# Patient Record
Sex: Male | Born: 1980 | Race: White | Hispanic: No | Marital: Single | Smoking: Former smoker
Health system: Southern US, Community
[De-identification: ages and names within clinical notes are randomized; demographics above are authoritative.]

---

## 2004-05-12 ENCOUNTER — Emergency Department: Payer: Self-pay | Admitting: Emergency Medicine

## 2005-07-29 ENCOUNTER — Emergency Department: Payer: Self-pay | Admitting: Emergency Medicine

## 2005-08-29 ENCOUNTER — Emergency Department: Payer: Self-pay | Admitting: General Practice

## 2005-09-16 ENCOUNTER — Emergency Department: Payer: Self-pay | Admitting: Unknown Physician Specialty

## 2005-09-16 ENCOUNTER — Other Ambulatory Visit: Payer: Self-pay

## 2008-02-17 ENCOUNTER — Emergency Department: Payer: Self-pay | Admitting: Emergency Medicine

## 2008-07-23 ENCOUNTER — Emergency Department: Payer: Self-pay | Admitting: Emergency Medicine

## 2008-12-16 ENCOUNTER — Emergency Department: Payer: Self-pay | Admitting: Emergency Medicine

## 2014-02-10 ENCOUNTER — Encounter: Payer: Self-pay | Admitting: Podiatry

## 2014-02-10 ENCOUNTER — Ambulatory Visit: Payer: No Typology Code available for payment source | Admitting: Podiatry

## 2014-02-10 VITALS — BP 125/76 | HR 61 | Resp 16 | Ht 73.0 in | Wt 240.0 lb

## 2014-02-10 DIAGNOSIS — M79609 Pain in unspecified limb: Secondary | ICD-10-CM

## 2014-02-10 DIAGNOSIS — B372 Candidiasis of skin and nail: Secondary | ICD-10-CM

## 2014-02-10 DIAGNOSIS — Z79899 Other long term (current) drug therapy: Secondary | ICD-10-CM

## 2014-02-10 DIAGNOSIS — B351 Tinea unguium: Secondary | ICD-10-CM

## 2014-02-10 MED ORDER — TERBINAFINE HCL 250 MG PO TABS: 250.0000 mg | ORAL_TABLET | Freq: Every day | ORAL | Status: DC

## 2014-02-10 NOTE — Progress Notes (Signed)
Subjective:     Patient ID: Robert Jensen, male   DOB: 12/25/1980, 33 y.o.   MRN: [id number]  HPI patient presents with significant nail disease of both feet and moccasin appearance of the skin secondary to fungal infection. States he had Lamisil of a short course years ago which improved but he stopped taking   Review of Systems  All other systems reviewed and are negative.      Objective:   Physical Exam  Nursing note and vitals reviewed. Constitutional: He is oriented to person, place, and time.  Cardiovascular: Intact distal pulses.   Musculoskeletal: Normal range of motion.  Neurological: He is oriented to person, place, and time.  Skin: Skin is warm.   neurovascular status intact with muscle strength adequate and range of motion subtalar and midtarsal joint within normal limits. Patient's found to have thick nails hallux bilateral and 3 and 5 of both feet along with a moccasin appearance to his skin consistent with change    Assessment:     Mycotic nail infection and skin infection    Plan:     H&P performed and condition discussed and have recommended oral Lamisil x90 days with first we will get liver function and CBC followed by formula 3 treatment. Reappoint in 5 months and hopefully we can put him on a maintenance dose

## 2014-02-10 NOTE — Progress Notes (Signed)
   Subjective:    Patient ID: Robert Jensen, male    DOB: 06/27/1981, 33 y.o.   MRN: [id number]  HPI Comments: i have a nail fungus on both of my feet. Ive had it 10 + years. They get better then worse again. i pull the nail off at times. i took lamisil 6 - 7 years ago. It did work but i didn't finish the prescription.      Review of Systems  Skin: Positive for color change.  All other systems reviewed and are negative.      Objective:   Physical Exam        Assessment & Plan:

## 2014-02-20 ENCOUNTER — Encounter: Payer: Self-pay | Admitting: Podiatry

## 2014-02-20 NOTE — Progress Notes (Signed)
Per dr Charlsie Merlesregal labs ok repeat in 8 weeks

## 2014-05-08 ENCOUNTER — Observation Stay: Payer: Self-pay | Admitting: Internal Medicine

## 2014-05-08 LAB — DRUG SCREEN, URINE

## 2014-05-08 LAB — URINALYSIS, COMPLETE
BACTERIA: NONE SEEN
BILIRUBIN, UR: NEGATIVE
BLOOD: NEGATIVE
LEUKOCYTE ESTERASE: NEGATIVE
NITRITE: NEGATIVE
PH: 8 (ref 4.5–8.0)
Protein: NEGATIVE
RBC,UR: 1 /HPF (ref 0–5)
Specific Gravity: 1.006 (ref 1.003–1.030)
Squamous Epithelial: <1
WBC UR: NONE SEEN /HPF (ref 0–5)

## 2014-05-08 LAB — COMPREHENSIVE METABOLIC PANEL
ALK PHOS: 97 U/L
ALT: 39 U/L
Albumin: 4 g/dL (ref 3.4–5.0)
Anion Gap: 10 (ref 7–16)
BILIRUBIN TOTAL: 0.7 mg/dL (ref 0.2–1.0)
BUN: 10 mg/dL (ref 7–18)
Calcium, Total: 8.4 mg/dL — ABNORMAL LOW (ref 8.5–10.1)
Chloride: 103 mmol/L (ref 98–107)
Co2: 25 mmol/L (ref 21–32)
Creatinine: 1.06 mg/dL (ref 0.60–1.30)
GLUCOSE: 193 mg/dL — AB (ref 65–99)
OSMOLALITY: 280 (ref 275–301)
POTASSIUM: 3.5 mmol/L (ref 3.5–5.1)
SGOT(AST): 21 U/L (ref 15–37)
Sodium: 138 mmol/L (ref 136–145)
Total Protein: 7.9 g/dL (ref 6.4–8.2)

## 2014-05-08 LAB — CBC
HCT: 46.4 %
HGB: 15.4 g/dL
MCH: 30.3 pg
MCHC: 33.2 g/dL
MCV: 91 fL
Platelet: 208 x10 3/mm 3
RBC: 5.08 x10 6/mm 3
RDW: 13.7 %
WBC: 5.5 x10 3/mm 3

## 2014-05-08 LAB — TSH: Thyroid Stimulating Horm: 0.836 u[IU]/mL

## 2014-05-08 LAB — PROTIME-INR
INR: 1.1
PROTHROMBIN TIME: 13.6 s (ref 11.5–14.7)

## 2014-05-08 LAB — TROPONIN I
Troponin-I: <0.02 ng/mL
Troponin-I: <0.02 ng/mL

## 2014-05-08 LAB — APTT: Activated PTT: 25.6 secs (ref 23.6–35.9)

## 2014-05-08 LAB — HEMOGLOBIN A1C: Hemoglobin A1C: 5.6 % (ref 4.2–6.3)

## 2014-05-09 DIAGNOSIS — R002 Palpitations: Secondary | ICD-10-CM

## 2014-05-09 DIAGNOSIS — G459 Transient cerebral ischemic attack, unspecified: Secondary | ICD-10-CM

## 2014-05-09 DIAGNOSIS — I10 Essential (primary) hypertension: Secondary | ICD-10-CM

## 2014-05-09 LAB — CBC WITH DIFFERENTIAL/PLATELET
BASOS PCT: 0.3 %
Basophil #: 0 10*3/uL (ref 0.0–0.1)
Eosinophil #: 0.1 10*3/uL (ref 0.0–0.7)
Eosinophil %: 2.2 %
HCT: 44.3 % (ref 40.0–52.0)
HGB: 14.8 g/dL (ref 13.0–18.0)
LYMPHS PCT: 26.3 %
Lymphocyte #: 1.8 10*3/uL (ref 1.0–3.6)
MCH: 31.1 pg (ref 26.0–34.0)
MCHC: 33.3 g/dL (ref 32.0–36.0)
MCV: 94 fL (ref 80–100)
MONO ABS: 0.8 x10 3/mm (ref 0.2–1.0)
MONOS PCT: 11.3 %
Neutrophil #: 4.1 10*3/uL (ref 1.4–6.5)
Neutrophil %: 59.9 %
PLATELETS: 204 10*3/uL (ref 150–440)
RBC: 4.74 10*6/uL (ref 4.40–5.90)
RDW: 13.9 % (ref 11.5–14.5)
WBC: 6.8 10*3/uL (ref 3.8–10.6)

## 2014-05-09 LAB — LIPID PANEL
CHOLESTEROL: 174 mg/dL (ref 0–200)
HDL: 36 mg/dL — AB (ref 40–60)
Ldl Cholesterol, Calc: 120 mg/dL — ABNORMAL HIGH (ref 0–100)
TRIGLYCERIDES: 91 mg/dL (ref 0–200)
VLDL Cholesterol, Calc: 18 mg/dL (ref 5–40)

## 2014-05-09 LAB — BASIC METABOLIC PANEL
ANION GAP: 9 (ref 7–16)
BUN: 9 mg/dL (ref 7–18)
CO2: 25 mmol/L (ref 21–32)
Calcium, Total: 8.1 mg/dL — ABNORMAL LOW (ref 8.5–10.1)
Chloride: 106 mmol/L (ref 98–107)
Creatinine: 0.78 mg/dL (ref 0.60–1.30)
EGFR (Non-African Amer.): >60
Glucose: 92 mg/dL (ref 65–99)
OSMOLALITY: 278 (ref 275–301)
Potassium: 3.6 mmol/L (ref 3.5–5.1)
Sodium: 140 mmol/L (ref 136–145)

## 2014-05-09 LAB — TROPONIN I

## 2014-07-14 ENCOUNTER — Ambulatory Visit: Payer: No Typology Code available for payment source | Admitting: Podiatry

## 2014-10-28 NOTE — Consult Note (Signed)
General Aspect Primary Cardiologist: New to [facility] _________________  34 year old male with history of HTN and onychomyocsis who presented to [facility] on 05/08/2014 after developing palpitations and left upper extremity and lower extremity numbness while at work. Paresthesias lasted for approximately 6 hours before self resolving. HR was found to be in the 120s with a BP of 170/110. Cardiology was consulted for further evaluation.  _________________  PMH: 1. HTN 2. Onychomyosis __________________   Present Illness 34 year old male with the above problem list who presented to [facility] on 05/08/2014 after developing palpitations and left upper extremity and lower extremity numbness while at work. Paresthesias lasted for approximately 6 hours before self resolving. HR was found to be in the 120s with a BP of 170/110. cardiology was consulted for possible TIA and tachycardia  Patient without any known prior cardiac history.  He states his BP has been previously well controlled having recently been seen in the outpatient setting with a reading in the 120s/80s. He does not take any antihypertensives. He was in his [employer] the prior day eating lunch at work (works at a [employer]) when he developed left upper and lower paresthesias. This was assocaited with palpitations. EMS was called. Patient had a HR in the 140s. BP 170/110. He was brought to [facility]. At [facility] his HR began to slow into the 120s. He was complaining of peri-oral numbness and initially unable to smile, however this resolved without intervention. He felt lightheaded. He drinks 2 caffeine drinks (1 soda and 1 coffee per day). No drugs. Drinks a couple of beers per week and smokes a couple of cigarettes.   Code stroke was not initiated. Neurology was not called. He denied any weakness, denied any blurry vision, double vision. He denied any swallowing difficulties or speech difficulties. EKG with sinus tach 119, baseline artifact, no st/t changes.  Telemetry NSR, 60s. TnI negative x 3. UDS negative. See labs for remaining details. CT head with no acute abnormality.   Physical Exam:  GEN well developed, well nourished, no acute distress   HEENT hearing intact to voice, moist oral mucosa   NECK supple   RESP normal resp effort  clear BS   CARD Regular rate and rhythm  Normal, S1, S2  No murmur   ABD denies tenderness  soft  normal BS   LYMPH negative neck   EXTR negative edema   SKIN normal to palpation   NEURO motor/sensory function intact   PSYCH alert, A+O to time, place, person, good insight   Review of Systems:  Subjective/Chief Complaint tingling and weakness on the left side, resolved Tachycardia, resolved   General: No Complaints   Skin: No Complaints   ENT: No Complaints   Eyes: No Complaints   Neck: No Complaints   Respiratory: No Complaints   Cardiovascular: Palpitations   Gastrointestinal: No Complaints   Genitourinary: No Complaints   Vascular: No Complaints   Musculoskeletal: No Complaints   Neurologic: paresthesias   Hematologic: No Complaints   Endocrine: No Complaints   Psychiatric: No Complaints   Review of Systems: All other systems were reviewed and found to be negative   Medications/Allergies Reviewed Medications/Allergies reviewed   Family & Social History:  Family and Social History:  Family History adopted, uncertain   Social History positive  tobacco, positive ETOH, negative Illicit drugs   + Tobacco Current (within 1 year)   Place of Living Home     htn:  Admit Diagnosis:   L ARM NUMBNESS: Onset Date: 09-May-2014, Status: Active, Description: L ARM NUMBNESS  Home Medications: Medication Instructions Status  terbinafine 250 mg oral tablet 1 tab(s) orally once a day Active   Lab Results:  Thyroid:  02-Nov-15 15:27   Thyroid Stimulating Hormone 0.836 (0.45-4.50 (IU = International Unit)  ----------------------- Pregnant patients have   different reference  ranges for TSH:  - - - - - - - - - -  Pregnant, first trimetser:  0.36 - 2.50 uIU/mL)  Routine Chem:  03-Nov-15 03:59   Glucose, Serum 92  BUN 9  Creatinine (comp) 0.78  Sodium, Serum 140  Potassium, Serum 3.6  Chloride, Serum 106  CO2, Serum 25  Calcium (Total), Serum  8.1  Anion Gap 9  Osmolality (calc) 278  eGFR (African American) >60  eGFR (Non-African American) >60 (eGFR values <31m/min/1.73 m2 may be an indication of chronic kidney disease (CKD). Calculated eGFR, using the MRDR Study equation, is useful in  patients with stable renal function. The eGFR calculation will not be reliable in acutely ill patients when serum creatinine is changing rapidly. It is not useful in patients on dialysis. The eGFR calculation may not be applicable to patients at the low and high extremes of body sizes, pregnant women, and vegetarians.)  Cholesterol, Serum 174  Triglycerides, Serum 91  HDL (INHOUSE)  36  VLDL Cholesterol Calculated 18  LDL Cholesterol Calculated  120 (Result(s) reported on 09 May 2014 at 05:15AM.)  Cardiac:  02-Nov-15 15:27   Troponin I < 0.02 (0.00-0.05 0.05 ng/mL or less: NEGATIVE  Repeat testing in 3-6 hrs  if clinically indicated. >0.05 ng/mL: POTENTIAL  MYOCARDIAL INJURY. Repeat  testing in 3-6 hrs if  clinically indicated. NOTE: An increase or decrease  of 30% or more on serial  testing suggests a  clinically important change)    19:48   Troponin I < 0.02 (0.00-0.05 0.05 ng/mL or less: NEGATIVE  Repeat testing in 3-6 hrs  if clinically indicated. >0.05 ng/mL: POTENTIAL  MYOCARDIAL INJURY. Repeat  testing in 3-6 hrs if  clinically indicated. NOTE: An increase or decrease  of 30% or more on serial  testing suggests a  clinically important change)  03-Nov-15 00:28   Troponin I < 0.02 (0.00-0.05 0.05 ng/mL or less: NEGATIVE  Repeat testing in 3-6 hrs  if clinically indicated. >0.05 ng/mL: POTENTIAL  MYOCARDIAL INJURY.  Repeat  testing in 3-6 hrs if  clinically indicated. NOTE: An increase or decrease  of 30% or more on serial  testing suggests a  clinically important change)  Routine Hem:  03-Nov-15 03:59   WBC (CBC) 6.8  RBC (CBC) 4.74  Hemoglobin (CBC) 14.8  Hematocrit (CBC) 44.3  Platelet Count (CBC) 204  MCV 94  MCH 31.1  MCHC 33.3  RDW 13.9  Neutrophil % 59.9  Lymphocyte % 26.3  Monocyte % 11.3  Eosinophil % 2.2  Basophil % 0.3  Neutrophil # 4.1  Lymphocyte # 1.8  Monocyte # 0.8  Eosinophil # 0.1  Basophil # 0.0 (Result(s) reported on 09 May 2014 at 05:06AM.)   EKG:  EKG Interp. by me   Interpretation EKG shows sinus tachycardia, 119, baseline artifact, no st/t changes   Radiology Results: Cardiology:    02-Nov-15 19:03, Echo Doppler  Echo Doppler   REASON FOR EXAM:      COMMENTS:       [facility]  - May 08 2014  7:03PM  RESULT: Echocardiogram Report    Patient Name:   Robert Jensen Date of Exam: 05/08/2014  Medical Rec #:  [id number]               Custom1:  Date of Birth:  [birth year]             Height:       73.0 in  Patient Age:    33 years             Weight:       253.0 lb  Patient Gender: M                    BSA:          2.38 m??    Indications: TIA  Sonographer:    Arville Go RDCS  Referring Phys: Delman Kitten, R    Summary:   1. No source of CVA or TIA noted.   2. Left ventricular ejection fraction, by visual estimation, is 60 to   65%.   3. Normal global left ventricular systolic function.   4. Normal right ventricular size and systolic function.   5. Cannot exclude ASD/PFO. Consider transesophageal echocardiogram, if   clinically indicated.   6. Normal RVSP  2D AND M-MODE MEASUREMENTS (normal ranges within parentheses):  Left Ventricle:          Normal  IVSd (2D):      0.99 cm (0.7-1.1)  LVPWd (2D):     1.07 cm (0.7-1.1) Aorta/LA:                  Normal  LVIDd (2D):     4.61 cm (3.4-5.7) Aortic Root  (2D): 2.80 cm (2.4-3.7)  LVIDs (2D):     2.89 cm           Left Atrium (2D): 3.60 cm (1.9-4.0)  LV FS (2D):     37.3 %   (>25%)  LV EF (2D):     67.4 %   (>50%)                                    Right Ventricle:                                    RVd (2D):  LV DIASTOLIC FUNCTION:  MV Peak E: 0.54 m/s Decel Time: 237 msec  MV Peak A: 0.69 m/s  E/A Ratio: 0.79  SPECTRAL DOPPLER ANALYSIS (where applicable):  Mitral Valve:  MV P1/2 Time: 68.73 msec  MV Area, PHT: 3.20 cm??  Aortic Valve: AoV Max Vel: 1.22 m/s AoV Peak PG: 6.0 mmHg AoV Mean PG:  LVOT Vmax: 1.02 m/s LVOT VTI:  LVOT Diameter: 2.30 cm  AoV Area, Vmax: 3.47 cm?? AoV Area, VTI:  AoV Area, Vmn:  Tricuspid Valve and PA/RV Systolic Pressure: TR Max Velocity: 2.00 m/s RA   Pressure: 5 mmHg RVSP/PASP: 21.0 mmHg  Pulmonic Valve:  PV Max Velocity: 1.22 m/s PV Max PG: 6.0 mmHg PV Mean PG:    PHYSICIAN INTERPRETATION:  Left Ventricle: The left ventricular internal cavity size was normal. LV   posterior wall thickness was normal. No left ventricular hypertrophy.   Global LV systolic function was normal. Left ventricular ejection   fraction, by visual estimation, is 60 to 65%. Spectral Doppler shows   normal pattern of  LV diastolic filling.  Right Ventricle: Normal right ventricular size, wall thickness, and   systolic function. The right ventricular size is normal. Global RV     systolic function is normal.  Left Atrium: The left atrium is normal in size.  Right Atrium: The right atrium is normal in size.  Pericardium: There is no evidence of pericardial effusion.  Mitral Valve: The mitral valve is normal in structure. Trace mitral valve   regurgitation is seen.  Tricuspid Valve: The tricuspid valve is normal. Trivial tricuspid   regurgitation is visualized. The tricuspid regurgitant velocity is 2.00   m/s, and with an assumed right atrial pressure of 5 mmHg, the estimated   right ventricular systolic pressure is normal at 21.0  mmHg.  Aortic Valve: The aortic valve is normal. The aortic valve is   structurally normal, with no evidence of sclerosis or stenosis. No   evidence of aortic valve regurgitation is seen.  Pulmonic Valve: The pulmonic valve isnormal. Trace pulmonic valve   regurgitation.  Aorta: The aortic root and ascending aorta are structurally normal, with   no evidence of dilitation.  Additional Comments: Flow jet at the atrial septum was seen, possible   venous return; however, could not exclude ASD/PFO. If clinically   indicated consider transesophageal echocardiogram.    [id number] Ida Rogue MD  Electronically signed by [id number] Ida Rogue MD  Signature Date/Time: 05/09/2014/10:24:28 AM    *** Final ***    IMPRESSION: .      Verified By: Minna Merritts, M.D., MD  CT:    02-Nov-15 15:36, CT Head Without Contrast  CT Head Without Contrast   REASON FOR EXAM:    CVA  COMMENTS:   May transport without cardiac monitor    PROCEDURE: CT  - CT HEAD WITHOUT CONTRAST  - May 08 2014  3:36PM     CLINICAL DATA:  Sudden onset of left facial numbness at 2:30 p.m.  today; no headache or visual disturbance or other symptoms    EXAM:  CT HEAD WITHOUT CONTRAST    TECHNIQUE:  Contiguous axial images were obtained from the base of the skull  through the vertex without intravenous contrast.  COMPARISON:  Noncontrast CT scan of the brain dated August 14,2009.    FINDINGS:  The ventricles are normal in size and position. There is no  intracranial hemorrhage nor intracranial mass effect. There is no  acute ischemic change. The cerebellum and brainstem are  unremarkable.    The observed paranasal sinuses and mastoid air cells are clear. The  calvarium is intact.     IMPRESSION:  There is no acute intracranial abnormality.    Electronically Signed    By: David  Martinique    On: 05/08/2014 15:48         Verified By: DAVID A. Martinique, M.D., MD    Other- Explain in Comments Line: Rash  Vital  Signs/Nurse's Notes: **Vital Signs.:   03-Nov-15 05:50  Vital Signs Type Routine  Temperature Temperature (F) 98.3  Celsius 36.8  Pulse Pulse 67  Respirations Respirations 18  Systolic BP Systolic BP 604  Diastolic BP (mmHg) Diastolic BP (mmHg) 82  Mean BP 103  Pulse Ox % Pulse Ox % 98  Pulse Ox Activity Level  At rest  Oxygen Delivery Room Air/ 21 %    Impression 34 year old male with history of HTN and onychomyocsis who presented to [facility] on 05/08/2014 after developing palpitations and left upper extremity and lower extremity numbness  while at work. Paresthesias lasted for approximately 6 hours before self resolving. HR was found to be in the 120s with a BP of 170/110.   1. Palpitations/sinus tachycardia -HR into the 120s-140s during the acute paresthesia episode. It is unclear if this was a precipitating event or 2/2 the event it self unable to exclude arrhythmia (vs anxiety attack), possible atrial tachycardia. tele strips not available -On telemetry here HR has been NSR in the 60s -Suggested a 30 day outpatient monitor, patient has declined at this time. He will call for monitor for recurrent sx. -Add propranolol 20 mg q6 prn palpitaitons   2. TIA: -CT head negative -Consider MRI brain - patient refused -Add aspirin , uinable to exclude PFO. not seen on echo but this does not exclude PFO. this was discussed with the patient. -Peri-oral paresthesias are frequently associated with anxiety it is unclear if this was an anxiety symptom at this time vs TIA symptom  3. HTN: -Patient reports BP is controlled at baseline -BP has been 130s-140s/70s-80s here -Lifestyle changes are a must  4) Anxiety: significant home stressors. separation, two kids, new house --unclear if this is playing a role in his presentation our office number was given for any further sx.   Electronic Signatures: Rise Mu (PA-C)  (Signed [phone] 10:45)  Authored: General Aspect/Present Illness, History  and Physical Exam, Review of System, Family & Social History, Past Medical History, Home Medications, Labs, EKG , Radiology, Allergies, Vital Signs/Nurse's Notes, Impression/Plan Ida Rogue (MD)  (Signed [phone] 13:50)  Authored: General Aspect/Present Illness, History and Physical Exam, Review of System, Family & Social History, Past Medical History, Health Issues, EKG , Radiology, Impression/Plan  Co-Signer: General Aspect/Present Illness, History and Physical Exam, Review of System, Family & Social History, Past Medical History, Home Medications, Labs, EKG , Radiology, Allergies, Vital Signs/Nurse's Notes, Impression/Plan   Last Updated: 03-Nov-15 13:50 by Ida Rogue (MD)

## 2014-10-28 NOTE — Discharge Summary (Signed)
PATIENT NAME:  Robert Jensen, Robert Jensen MR#:  [id number] DATE OF BIRTH:  [birth year]  DATE OF ADMISSION:  05/08/2014 DATE OF DISCHARGE:  05/09/2014  ADMISSION DIAGNOSIS:  Numbness of his left hand with possible transient ischemic attack.   DISCHARGE DIAGNOSES:  1.  Accelerated hypertension causing numbness of his left hand.  2.  Palpitations.  3.  History of hypertension.   CONSULTATIONS:  Cardiology.   LABORATORY DATA:  Discharge white blood cells 6.8, hemoglobin 15, hematocrit 45, platelets of 204,000, sodium 140, potassium 3.6, chloride 106, bicarbonate 25, BUN of 9, creatinine is 0.78, glucose is 92.   Troponins are negative.   A 2D echocardiogram showed normal ejection fraction. No abnormalities noted.   HOSPITAL COURSE:  A 34 year old male who presented with palpitations, elevated blood pressure, numbness of his left hand. For further details, please refer to the H and P.  1.  Accelerated blood pressure. I suspect that this is the etiology of his paresthesias along with his palpitations. He was probably anxious, causing elevation in his blood pressure palpitations. His blood pressure actually was systolic in the 130s while he was in the hospital and did not require any additional medications. His numbness of his hand completely resolved before admission. As his blood pressure did improve in the ER. No suspicion for a transient ischemic attack or cerebrovascular accident at this time.  2.  Paresthesias of his left hand secondary to problem number 1. No evidence of cerebrovascular accident or transient ischemic attack as mentioned above. His initial CT of the head was negative.  3.  Palpitations. We appreciate cardiology consultation. The patient's telemetry monitoring was completely normal with normal sinus rhythm. Heart rates in the 60s. They did recommend propranolol 80 mg p.o. b.i.d. p.r.n.  4.  History of hypertension. The patient does have high blood pressure. He was not started on  medications at this time. We talked about diet and lifestyle changes and referral to an outpatient physician for further evaluation.   PHYSICAL EXAMINATION:  On discharge:  VITAL SIGNS:  Temperature is 98.3, pulse is 67, respirations 18, blood pressure 132/77, and 98 on room air.  GENERAL:  The patient was alert and oriented, not in acute distress.  CARDIOVASCULAR:  Regular rate and rhythm. No murmurs, gallops, or rubs. PMI was not displaced. LUNGS:  Clear to auscultation. No crackles, rales, rhonchi, or wheezing. Normal to percussion. ABDOMEN:  Bowel sounds are positive, nontender, nondistended, no hepatosplenomegaly. EXTREMITIES:  No clubbing, cyanosis, or edema. NEUROLOGIC:  Cranial nerves II through XII are intact. Strength is 5/5. Sensation is intact bilaterally.   DISCHARGE MEDICATIONS:  1.  Propranolol 80 mg p.o. b.i.d. p.r.n.  2.  Terbinafine 250 mg daily.  DISCHARGE DIET:  Low sodium.   DISCHARGE ACTIVITY:  As tolerated.   DISCHARGE FOLLOWUP:  The patient will need a followup with her primary care physician within the next week and he will see Dr. Dossie Arbourrissman in 1-2 weeks.  TIME SPENT:  Approximately 35 minutes. The patient is stable for discharge.    ____________________________ Ayahna Solazzo P. Juliene PinaMody, MD spm:lt D: 05/09/2014 12:08:42 ET T: 05/09/2014 21:18:25 ET JOB#: [id number]  cc: Dajour Pierpoint P. Juliene PinaMody, MD, <Dictator> Steele SizerMark A. Crissman, MD Janyth ContesSITAL P Nalaya Wojdyla MD ELECTRONICALLY SIGNED 05/10/2014 13:31

## 2014-10-28 NOTE — H&P (Signed)
PATIENT NAME:  Robert Jensen, Robert Jensen DATE OF BIRTH:  06-11-1981  DATE OF ADMISSION:  05/08/2014   PRIMARY CARE PHYSICIAN:  Dr.  Dossie Arbourrissman   The patient is a 34 year old male with past medical history significant for history of hypertension, also toenail fungus, who presents to the hospital with complaints of left-sided numbness.  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  He also noted left arm numbness as well as feeling like his left leg was tingling. His mouth also somewhat felt funny.  It happened around 2:30 p.m. It lasted as least until 3:00 p.m.  He also noted palpitations with heart rate of 140.  His blood pressure was measured at work and was found to be 170/110. His heart rate was 140 and EMS was called. When EMS was called his heart rate still remained very high. EMS recommended him to Valsalva and while doing that, patient suddenly passed out.  EMS established IV line and took patient to Emergency Room. On arrival to the Emergency Room patient's heart rate remained around 120. He felt somewhat lightheaded. He still had some left arm tingling; however, the patient's numbness has been resolved. He does not have any weakness, and did not have any weakness at all. He lips felt like they were swollen and felt as if he was not able to smile initially during the initial presentation, However, he denied any weakness, denied any blurry vision, double vision. He denied any swallowing difficulties or speech difficulties.  On arrival to the hospital the patient's vital signs revealed elevated blood pressure to 150s and he still remained tachycardic. His labs showed elevated glucose level to 190s. CT scan of head revealed no acute intracranial abnormality. Hospitalist services were contacted for admission.   PAST MEDICAL HISTORY: Significant for history of hypertension, although the patient is not on any  medications and says his blood pressure was running quite good recently. Also toenail fungus for which he is taking terbinafine for the past 3 months. Skin condition, diagnosis of which the patient is not sure about, possible vitiligo.  MEDICATIONS: Terbinafine 250 mg p.o. daily for the past 3 months.   PAST SURGICAL HISTORY: None.   ALLERGIES: None.   FAMILY HISTORY: The patient was adopted. He does not know any family.   SOCIAL HISTORY: The patient is separated. He has 2 children, girls, 34 years old and 5611 months old.  He smokes approximately 1 pack  in a month. He drinks a couple of beers in a week. 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 He intends to become full-time Emergency planning/management officerpolice officer.  REVIEW OF SYSTEMS:  CONSTITUTIONAL: Denies blurry or double vision, has some left-sided numbness, tingling, palpitations, syncope, otherwise denies any fever or chills, fatigue, weakness, pains, weight loss or gain.  EYES:  Denies any double vision, glaucoma, cataracts. EARS, NOSE, AND THROAT: Denies any tinnitus, allergies, epistaxis, sinus pain, dentures, or difficulty swallowing.  RESPIRATORY: Denies any cough, wheezes, asthma, COPD.  CARDIOVASCULAR: Denies any chest pains, orthopnea, edema, arrhythmias. GASTROINTESTINAL: Denies any nausea, vomiting, diarrhea, or constipation.  GENITOURINARY: Denies dysuria, hematuria, frequency, incontinence. ENDOCRINOLOGY: Denies any polydipsia, nocturia, thyroid problems, heat or cold intolerance, or thirst.  HEMATOLOGIC: Denies anemia, easy bruising or bleeding, swollen glands.  SKIN: Denies acne, rash, lesions, or change in moles.  MUSCULOSKELETAL: Denies arthritis, cramps, or swelling. NEUROLOGIC: Denies numbness, epilepsy, or tremor.  PSYCHIATRIC: Denies anxiety, insomnia,  or depression.  PHYSICAL EXAMINATION:  VITAL SIGNS:  On arrival to the hospital temperature was 97.8,  pulse 118, respirations 20, blood pressure 136/75, saturation  was 98% on room air.  GENERAL: This is a well-developed, well-nourished, mildly obese male, sitting on the stretcher.  HEENT: His pupils are equal, reactive to light. Extraocular muscles intact, no icterus or conjunctivitis. Has normal hearing. No pharyngeal erythema. Mucosa is moist. NECK: No masses. Supple, nontender. Thyroid is not enlarged. No adenopathy. No JVD or carotid bruits bilaterally. Full range of motion.  LUNGS: Clear to auscultation in all fields. No rales, rhonchi, diminished breath sounds, or wheezing. No labored inspiration, increased effort, dullness to percussion, or overt respiratory distress.  CARDIOVASCULAR: S1, S2 appreciated. The rhythm is regular, slightly tachycardic.  No murmurs or gallops are noted. PMI not lateralized. Chest is nontender to palpation. Normal  peripheral pulses.  No lower extremity edema, calf tenderness, or cyanosis was noted. ABDOMEN: Soft, nontender. Bowel sounds are present. No hepatosplenomegaly or masses were noted.  RECTAL: Deferred.  MUSCLE STRENGTH: Able to move all extremities. No cyanosis, degenerative joint disease, or kyphosis. Gait was not tested.  SKIN: Revealed multiple lesions on the trunk which are of lighter pigmentation.  They are very well demarcated, but no erythema, nodularity, or induration. Skin was warm and dry to palpation. LYMPHATIC: No adenopathy in the cervical region.  NEUROLOGIC:  Cranial nerves were grossly intact. Deep tendon reflexes intact. Sensory was diminished to light touch on the left side, left arm, but normal on the left leg. Babinski negative. No dysarthria or aphasia. PSYCHIATRIC: The patient is alert, oriented to person and place, cooperative. Memory is good, no significant confusion, agitation, or depression were noted.   LABORATORY DATA: BMP, glucose level of 193, otherwise unremarkable. Hemoglobin A1c was 5.6. Liver enzymes were normal. The patient's troponin less than 0.02. TSH was 0.836. CBC within normal  limits with white blood cell count 5.5, hemoglobin 15.4, platelet count 208,000. Coagulation panel normal. The patient's urinalysis was unremarkable.   RADIOLOGIC STUDIES: CT scan of head without contrast 05/08/2014, revealed no acute intracranial abnormality.   ASSESSMENT AND PLAN: 1.  Transient ischemic attack with left-sided numbness, tingling, and decreased light touch sensation. The patient's symptoms are resolving. Admit patient to medical floor. Starting him on aspirin, Lipitor. Will get neurologic consultation. Will get also carotid ultrasound, TSH, echocardiogram and a hemoglobin A1c checked and lipid panel checked in the morning.  2.  Malignant essential hypertension. The patient was advised to cut salt in food intake; however, the patient's blood pressure was fluctuating around 170s/110s diastolic.  We may need to initiate blood pressure medications.  3.  Palpitations. The patient does have sinus tachycardia on EKG, questionable any other etiology such as supraventricular tachycardia or atrial fibrillation during acute episode. We will continue the patient on telemetry. We will get echocardiogram, as well as cardiology consultation. The patient may need 30 day monitor.  4.  Hyperglycemia. Hemoglobin A1c is unremarkable, does not seem to be that it is diabetes.   TIME SPENT: 50 minutes on the patient.    ____________________________ Katharina Caper, MD rv:LT D: 05/08/2014 17:46:00 ET T: 05/08/2014 19:45:57 ET JOB#: [id number]  cc: DR. Karlyne Greenspan, MD, <Dictator>   Sharia Averitt MD ELECTRONICALLY SIGNED 05/25/2014 14:52

## 2016-08-09 ENCOUNTER — Ambulatory Visit (INDEPENDENT_AMBULATORY_CARE_PROVIDER_SITE_OTHER): Payer: Worker's Compensation

## 2016-08-09 ENCOUNTER — Encounter: Payer: Self-pay | Admitting: Gynecology

## 2016-08-09 ENCOUNTER — Ambulatory Visit
Admission: EM | Admit: 2016-08-09 | Discharge: 2016-08-09 | Disposition: A | Discharge status: Home / Self Care | Payer: Worker's Compensation | Attending: Family Medicine | Admitting: Family Medicine

## 2016-08-09 DIAGNOSIS — S6991XA Unspecified injury of right wrist, hand and finger(s), initial encounter: Secondary | ICD-10-CM | POA: Diagnosis not present

## 2016-08-09 MED ORDER — IBUPROFEN 800 MG PO TABS: 800.0000 mg | ORAL_TABLET | Freq: Three times a day (TID) | ORAL | 0 refills | Status: DC
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
## 2016-08-09 NOTE — Discharge Instructions (Signed)
Rest, ice, compression, elevation.  Ibuprofen as needed.  Take care  Dr.
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
## 2018-05-07 ENCOUNTER — Encounter: Payer: Self-pay | Admitting: Family Medicine

## 2018-05-07 ENCOUNTER — Ambulatory Visit (INDEPENDENT_AMBULATORY_CARE_PROVIDER_SITE_OTHER): Payer: No Typology Code available for payment source | Admitting: Family Medicine

## 2018-05-07 VITALS — BP 130/86 | HR 75 | Temp 98.6°F | Ht 72.3 in | Wt 279.5 lb

## 2018-05-07 DIAGNOSIS — Z7689 Persons encountering health services in other specified circumstances: Secondary | ICD-10-CM

## 2018-05-07 DIAGNOSIS — E669 Obesity, unspecified: Secondary | ICD-10-CM

## 2018-05-07 DIAGNOSIS — Z23 Encounter for immunization: Secondary | ICD-10-CM

## 2018-05-07 NOTE — Assessment & Plan Note (Signed)
Counseling given on diet and exercise

## 2018-05-07 NOTE — Progress Notes (Signed)
   BP 130/86   Pulse 75   Temp 98.6 F (37 C) (Oral)   Ht 6' 0.3" (1.836 m)   Wt 279 lb 8 oz (126.8 kg)   SpO2 96%   BMI 37.59 kg/m    Subjective:    Patient ID: Robert Jensen, male    DOB: [birth year], 37 y.o.   MRN: [id number]  HPI: Robert Jensen is a 37 y.o. male  Chief Complaint  Patient presents with  . Establish Care    no concerns   Here today to establish care. No known medical problems, not on any medications. Has no concerns today. Has not had a CPE in several years.   Relevant past medical, surgical, family and social history reviewed and updated as indicated. Interim medical history since our last visit reviewed. Allergies and medications reviewed and updated.  Review of Systems  Per HPI unless specifically indicated above     Objective:    BP 130/86   Pulse 75   Temp 98.6 F (37 C) (Oral)   Ht 6' 0.3" (1.836 m)   Wt 279 lb 8 oz (126.8 kg)   SpO2 96%   BMI 37.59 kg/m   Wt Readings from Last 3 Encounters:  05/07/18 279 lb 8 oz (126.8 kg)  08/09/16 255 lb (115.7 kg)  02/10/14 240 lb (108.9 kg)    Physical Exam  Constitutional: He is oriented to person, place, and time. He appears well-developed and well-nourished. No distress.  HENT:  Head: Atraumatic.  Eyes: Conjunctivae and EOM are normal.  Neck: Normal range of motion. Neck supple.  Cardiovascular: Normal rate, regular rhythm and normal heart sounds.  Pulmonary/Chest: Effort normal and breath sounds normal.  Musculoskeletal: Normal range of motion.  Neurological: He is alert and oriented to person, place, and time.  Skin: Skin is warm and dry.  Psychiatric: He has a normal mood and affect. His behavior is normal.  Nursing note and vitals reviewed.   No results found for this or any previous visit.    Assessment & Plan:   Problem List Items Addressed This Visit      Other   Obesity (BMI 35.0-39.9 without comorbidity) - Primary    Counseling given on diet and exercise         Other Visit Diagnoses    Encounter to establish care       Need for influenza vaccination       Relevant Orders   Flu Vaccine QUAD 36+ mos IM (Completed)       Follow up plan: Return for CPE.

## 2018-05-07 NOTE — Patient Instructions (Signed)

## 2018-05-20 ENCOUNTER — Ambulatory Visit (INDEPENDENT_AMBULATORY_CARE_PROVIDER_SITE_OTHER): Payer: No Typology Code available for payment source | Admitting: Family Medicine

## 2018-05-20 ENCOUNTER — Other Ambulatory Visit: Payer: Self-pay

## 2018-05-20 ENCOUNTER — Encounter: Payer: Self-pay | Admitting: Family Medicine

## 2018-05-20 VITALS — BP 126/87 | HR 72 | Temp 98.1°F | Ht 72.0 in | Wt 279.0 lb

## 2018-05-20 DIAGNOSIS — Z0000 Encounter for general adult medical examination without abnormal findings: Secondary | ICD-10-CM | POA: Diagnosis not present

## 2018-05-20 LAB — UA/M W/RFLX CULTURE, ROUTINE
BILIRUBIN UA: NEGATIVE
GLUCOSE, UA: NEGATIVE
Ketones, UA: NEGATIVE
Leukocytes, UA: NEGATIVE
Nitrite, UA: NEGATIVE
PROTEIN UA: NEGATIVE
SPEC GRAV UA: 1.02 (ref 1.005–1.030)
Urobilinogen, Ur: 0.2 mg/dL (ref 0.2–1.0)
pH, UA: 5 (ref 5.0–7.5)

## 2018-05-20 LAB — MICROSCOPIC EXAMINATION: BACTERIA UA: NONE SEEN

## 2018-05-20 NOTE — Progress Notes (Signed)
BP 126/87   Pulse 72   Temp 98.1 F (36.7 C) (Oral)   Ht 6' (1.829 m)   Wt 279 lb (126.6 kg)   SpO2 96%   BMI 37.84 kg/m    Subjective:    Patient ID: Robert Jensen, male    DOB: [birth year], 37 y.o.   MRN: [id number]  HPI: Clayten Allcock is a 37 y.o. male presenting on 05/20/2018 for comprehensive medical examination. Current medical complaints include:none  He currently lives with: Interim Problems from his last visit: no  Depression Screen done today and results listed below:  Depression screen [facility] 2/9 05/07/2018  Decreased Interest 0  Down, Depressed, Hopeless 0  PHQ - 2 Score 0  Altered sleeping 0  Tired, decreased energy 0  Change in appetite 0  Feeling bad or failure about yourself  0  Trouble concentrating 0  Moving slowly or fidgety/restless 0  Suicidal thoughts 0  PHQ-9 Score 0  Difficult doing work/chores Not difficult at all    The patient does not have a history of falls. I did not complete a risk assessment for falls. A plan of care for falls was not documented.   Past Medical History:  History reviewed. No pertinent past medical history.  Surgical History:  History reviewed. No pertinent surgical history.  Medications:  No current outpatient medications on file prior to visit.   No current facility-administered medications on file prior to visit.     Allergies:  Allergies  Allergen Reactions  . Other Rash    Nitrile latex free gloves    Social History:  Social History   Socioeconomic History  . Marital status: Single    Spouse name: Not on file  . Number of children: Not on file  . Years of education: Not on file  . Highest education level: Not on file  Occupational History  . Not on file  Social Needs  . Financial resource strain: Not on file  . Food insecurity:    Worry: Not on file    Inability: Not on file  . Transportation needs:    Medical: Not on file    Non-medical: Not on file  Tobacco Use  . Smoking  status: Former Smoker    Last attempt to quit: 05/07/2010    Years since quitting: 8.0  . Smokeless tobacco: Former Neurosurgeon    Quit date: 05/07/1998  Substance and Sexual Activity  . Alcohol use: Yes    Alcohol/week: 20.0 standard drinks    Types: 20 Cans of beer per week  . Drug use: Never  . Sexual activity: Yes  Lifestyle  . Physical activity:    Days per week: Not on file    Minutes per session: Not on file  . Stress: Not on file  Relationships  . Social connections:    Talks on phone: Not on file    Gets together: Not on file    Attends religious service: Not on file    Active member of club or organization: Not on file    Attends meetings of clubs or organizations: Not on file    Relationship status: Not on file  . Intimate partner violence:    Fear of current or ex partner: Not on file    Emotionally abused: Not on file    Physically abused: Not on file    Forced sexual activity: Not on file  Other Topics Concern  . Not on file  Social History Narrative  . Not on file  Social History   Tobacco Use  Smoking Status Former Smoker  . Last attempt to quit: 05/07/2010  . Years since quitting: 8.0  Smokeless Tobacco Former NeurosurgeonUser  . Quit date: 05/07/1998   Social History   Substance and Sexual Activity  Alcohol Use Yes  . Alcohol/week: 20.0 standard drinks  . Types: 20 Cans of beer per week    Family History:  Family History  Adopted: Yes    Past medical history, surgical history, medications, allergies, family history and social history reviewed with patient today and changes made to appropriate areas of the chart.   Review of Systems - General ROS: negative Psychological ROS: negative Ophthalmic ROS: negative ENT ROS: negative Allergy and Immunology ROS: negative Hematological and Lymphatic ROS: negative Endocrine ROS: negative Breast ROS: negative for breast lumps Respiratory ROS: no cough, shortness of breath, or wheezing Cardiovascular ROS: no chest  pain or dyspnea on exertion Gastrointestinal ROS: no abdominal pain, change in bowel habits, or black or bloody stools Genito-Urinary ROS: no dysuria, trouble voiding, or hematuria Musculoskeletal ROS: negative Neurological ROS: no TIA or stroke symptoms Dermatological ROS: negative All other ROS negative except what is listed above and in the HPI.      Objective:    BP 126/87   Pulse 72   Temp 98.1 F (36.7 C) (Oral)   Ht 6' (1.829 m)   Wt 279 lb (126.6 kg)   SpO2 96%   BMI 37.84 kg/m   Wt Readings from Last 3 Encounters:  05/20/18 279 lb (126.6 kg)  05/07/18 279 lb 8 oz (126.8 kg)  08/09/16 255 lb (115.7 kg)    Physical Exam  Constitutional: He is oriented to person, place, and time. He appears well-developed and well-nourished. No distress.  HENT:  Head: Atraumatic.  Right Ear: External ear normal.  Left Ear: External ear normal.  Nose: Nose normal.  Mouth/Throat: Oropharynx is clear and moist.  Eyes: Pupils are equal, round, and reactive to light. Conjunctivae are normal. No scleral icterus.  Neck: Normal range of motion. Neck supple.  Cardiovascular: Normal rate, regular rhythm, normal heart sounds and intact distal pulses.  No murmur heard. Pulmonary/Chest: Effort normal and breath sounds normal. No respiratory distress.  Abdominal: Soft. Bowel sounds are normal. He exhibits no distension and no mass. There is no tenderness. There is no guarding.  Musculoskeletal: Normal range of motion. He exhibits no edema or tenderness.  Neurological: He is alert and oriented to person, place, and time. He has normal reflexes.  Skin: Skin is warm and dry. No rash noted.  Psychiatric: He has a normal mood and affect. His behavior is normal.  Nursing note and vitals reviewed.   No results found for this or any previous visit.    Assessment & Plan:   Problem List Items Addressed This Visit    None    Visit Diagnoses    Annual physical exam    -  Primary   Relevant Orders     CBC with Differential/Platelet   Comprehensive metabolic panel   Lipid Panel w/o Chol/HDL Ratio   TSH   UA/M w/rflx Culture, Routine       Discussed aspirin prophylaxis for myocardial infarction prevention and decision was it was not indicated  LABORATORY TESTING:  Health maintenance labs ordered today as discussed above.   The natural history of prostate cancer and ongoing controversy regarding screening and potential treatment outcomes of prostate cancer has been discussed with the patient. The meaning of a false  positive PSA and a false negative PSA has been discussed. He indicates understanding of the limitations of this screening test and wishes not to proceed with screening PSA testing.   IMMUNIZATIONS:   - Tdap: Tetanus vaccination status reviewed: last tetanus booster within 10 years. - Influenza: Up to date  PATIENT COUNSELING:    Sexuality: Discussed sexually transmitted diseases, partner selection, use of condoms, avoidance of unintended pregnancy  and contraceptive alternatives.   Advised to avoid cigarette smoking.  I discussed with the patient that most people either abstain from alcohol or drink within safe limits (<=14/week and <=4 drinks/occasion for males, <=7/weeks and <= 3 drinks/occasion for females) and that the risk for alcohol disorders and other health effects rises proportionally with the number of drinks per week and how often a drinker exceeds daily limits.  Discussed cessation/primary prevention of drug use and availability of treatment for abuse.   Diet: Encouraged to adjust caloric intake to maintain  or achieve ideal body weight, to reduce intake of dietary saturated fat and total fat, to limit sodium intake by avoiding high sodium foods and not adding table salt, and to maintain adequate dietary potassium and calcium preferably from fresh fruits, vegetables, and low-fat dairy products.    stressed the importance of regular exercise  Injury  prevention: Discussed safety belts, safety helmets, smoke detector, smoking near bedding or upholstery.   Dental health: Discussed importance of regular tooth brushing, flossing, and dental visits.   Follow up plan: NEXT PREVENTATIVE PHYSICAL DUE IN 1 YEAR. Return in about 1 year (around 05/21/2019) for CPE.

## 2018-05-20 NOTE — Patient Instructions (Signed)
Follow up in 1 year.

## 2018-05-21 ENCOUNTER — Encounter: Payer: Self-pay | Admitting: Family Medicine

## 2018-05-21 LAB — CBC WITH DIFFERENTIAL/PLATELET
BASOS ABS: 0 10*3/uL (ref 0.0–0.2)
BASOS: 1 %
EOS (ABSOLUTE): 0.3 10*3/uL (ref 0.0–0.4)
Eos: 5 %
HEMATOCRIT: 44.2 % (ref 37.5–51.0)
Hemoglobin: 16 g/dL (ref 13.0–17.7)
Immature Grans (Abs): 0 10*3/uL (ref 0.0–0.1)
Immature Granulocytes: 0 %
Lymphocytes Absolute: 2 10*3/uL (ref 0.7–3.1)
Lymphs: 35 %
MCH: 32.1 pg (ref 26.6–33.0)
MCHC: 36.2 g/dL — AB (ref 31.5–35.7)
MCV: 89 fL (ref 79–97)
MONOS ABS: 0.7 10*3/uL (ref 0.1–0.9)
Monocytes: 13 %
NEUTROS ABS: 2.7 10*3/uL (ref 1.4–7.0)
Neutrophils: 46 %
PLATELETS: 229 10*3/uL (ref 150–450)
RBC: 4.99 x10E6/uL (ref 4.14–5.80)
RDW: 13.6 % (ref 12.3–15.4)
WBC: 5.8 10*3/uL (ref 3.4–10.8)

## 2018-05-21 LAB — COMPREHENSIVE METABOLIC PANEL
ALBUMIN: 4.2 g/dL (ref 3.5–5.5)
ALT: 48 IU/L — ABNORMAL HIGH (ref 0–44)
AST: 27 IU/L (ref 0–40)
Albumin/Globulin Ratio: 1.5 (ref 1.2–2.2)
Alkaline Phosphatase: 85 IU/L (ref 39–117)
BUN / CREAT RATIO: 11 (ref 9–20)
BUN: 9 mg/dL (ref 6–20)
Bilirubin Total: 0.4 mg/dL (ref 0.0–1.2)
CALCIUM: 8.4 mg/dL — AB (ref 8.7–10.2)
CO2: 21 mmol/L (ref 20–29)
CREATININE: 0.79 mg/dL (ref 0.76–1.27)
Chloride: 105 mmol/L (ref 96–106)
GFR calc Af Amer: 133 mL/min/{1.73_m2} (ref >59)
GFR, EST NON AFRICAN AMERICAN: 115 mL/min/{1.73_m2} (ref >59)
Globulin, Total: 2.8 g/dL (ref 1.5–4.5)
Glucose: 91 mg/dL (ref 65–99)
Potassium: 4.3 mmol/L (ref 3.5–5.2)
Sodium: 140 mmol/L (ref 134–144)
TOTAL PROTEIN: 7 g/dL (ref 6.0–8.5)

## 2018-05-21 LAB — TSH: TSH: 1.76 u[IU]/mL (ref 0.450–4.500)

## 2018-05-21 LAB — LIPID PANEL W/O CHOL/HDL RATIO
Cholesterol, Total: 221 mg/dL — ABNORMAL HIGH (ref 100–199)
HDL: 34 mg/dL — ABNORMAL LOW (ref >39)
LDL CALC: 150 mg/dL — AB (ref 0–99)
TRIGLYCERIDES: 183 mg/dL — AB (ref 0–149)
VLDL Cholesterol Cal: 37 mg/dL (ref 5–40)

## 2018-06-23 ENCOUNTER — Ambulatory Visit: Payer: No Typology Code available for payment source | Admitting: Family Medicine

## 2018-06-23 ENCOUNTER — Encounter: Payer: Self-pay | Admitting: Family Medicine

## 2018-06-23 DIAGNOSIS — B009 Herpesviral infection, unspecified: Secondary | ICD-10-CM

## 2018-06-23 MED ORDER — VALACYCLOVIR HCL 1 G PO TABS: 1000.0000 mg | ORAL_TABLET | Freq: Two times a day (BID) | ORAL | 6 refills | Status: DC

## 2018-06-23 NOTE — Assessment & Plan Note (Signed)
Pt wanting to continue with spot treatment for now, will refill valtrex for prn use. He will call if he decides he needs daily dosing

## 2018-06-23 NOTE — Progress Notes (Signed)
BP 137/87   Pulse 97   Temp 98 F (36.7 C) (Oral)   Wt 279 lb (126.6 kg)   SpO2 100%   BMI 37.84 kg/m    Subjective:    Patient ID: Robert Jensen, male    DOB: 08-Apr-1981, 37 y.o.   MRN: [id number]  HPI: Robert Jensen is a 37 y.o. male  Chief Complaint  Patient presents with  . 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 Having more frequent flares lately while under stress, about once a month when bad but can go months without an issue. Previously getting scripts as needed through a work Solicitor. Denies any issues on the medicine.   Relevant past medical, surgical, family and social history reviewed and updated as indicated. Interim medical history since our last visit reviewed. Allergies and medications reviewed and updated.  Review of Systems  Per HPI unless specifically indicated above     Objective:    BP 137/87   Pulse 97   Temp 98 F (36.7 C) (Oral)   Wt 279 lb (126.6 kg)   SpO2 100%   BMI 37.84 kg/m   Wt Readings from Last 3 Encounters:  06/23/18 279 lb (126.6 kg)  05/20/18 279 lb (126.6 kg)  05/07/18 279 lb 8 oz (126.8 kg)    Physical Exam Vitals signs and nursing note reviewed.  Constitutional:      Appearance: Normal appearance.  HENT:     Head: Atraumatic.  Eyes:     Extraocular Movements: Extraocular movements intact.     Conjunctiva/sclera: Conjunctivae normal.  Neck:     Musculoskeletal: Normal range of motion and neck supple.  Cardiovascular:     Rate and Rhythm: Normal rate and regular rhythm.     Pulses: Normal pulses.     Heart sounds: Normal heart sounds.  Pulmonary:     Effort: Pulmonary effort is normal.     Breath sounds: Normal breath sounds.  Musculoskeletal: Normal range of motion.  Skin:    General: Skin is warm and dry.     Findings: No lesion.  Neurological:     Mental Status: He is alert and oriented to person, place, and time.  Psychiatric:        Mood and Affect: Mood normal.         Thought Content: Thought content normal.     Results for orders placed or performed in visit on 05/20/18  Microscopic Examination  Result Value Ref Range   WBC, UA 0-5 0 - 5 /hpf   RBC, UA 0-2 0 - 2 /hpf   Epithelial Cells (non renal) 0-10 0 - 10 /hpf   Bacteria, UA None seen None seen/Few  CBC with Differential/Platelet  Result Value Ref Range   WBC 5.8 3.4 - 10.8 x10E3/uL   RBC 4.99 4.14 - 5.80 x10E6/uL   Hemoglobin 16.0 13.0 - 17.7 g/dL   Hematocrit 40.9 81.1 - 51.0 %   MCV 89 79 - 97 fL   MCH 32.1 26.6 - 33.0 pg   MCHC 36.2 (H) 31.5 - 35.7 g/dL   RDW 91.4 78.2 - 95.6 %   Platelets 229 150 - 450 x10E3/uL   Neutrophils 46 Not Estab. %   Lymphs 35 Not Estab. %   Monocytes 13 Not Estab. %   Eos 5 Not Estab. %   Basos 1 Not Estab. %   Neutrophils Absolute 2.7 1.4 - 7.0 x10E3/uL   Lymphocytes Absolute 2.0 0.7 -  3.1 x10E3/uL   Monocytes Absolute 0.7 0.1 - 0.9 x10E3/uL   EOS (ABSOLUTE) 0.3 0.0 - 0.4 x10E3/uL   Basophils Absolute 0.0 0.0 - 0.2 x10E3/uL   Immature Granulocytes 0 Not Estab. %   Immature Grans (Abs) 0.0 0.0 - 0.1 x10E3/uL  Comprehensive metabolic panel  Result Value Ref Range   Glucose 91 65 - 99 mg/dL   BUN 9 6 - 20 mg/dL   Creatinine, Ser 1.610.79 0.76 - 1.27 mg/dL   GFR calc non Af Amer 115 >59 mL/min/1.73   GFR calc Af Amer 133 >59 mL/min/1.73   BUN/Creatinine Ratio 11 9 - 20   Sodium 140 134 - 144 mmol/L   Potassium 4.3 3.5 - 5.2 mmol/L   Chloride 105 96 - 106 mmol/L   CO2 21 20 - 29 mmol/L   Calcium 8.4 (L) 8.7 - 10.2 mg/dL   Total Protein 7.0 6.0 - 8.5 g/dL   Albumin 4.2 3.5 - 5.5 g/dL   Globulin, Total 2.8 1.5 - 4.5 g/dL   Albumin/Globulin Ratio 1.5 1.2 - 2.2   Bilirubin Total 0.4 0.0 - 1.2 mg/dL   Alkaline Phosphatase 85 39 - 117 IU/L   AST 27 0 - 40 IU/L   ALT 48 (H) 0 - 44 IU/L  Lipid Panel w/o Chol/HDL Ratio  Result Value Ref Range   Cholesterol, Total 221 (H) 100 - 199 mg/dL   Triglycerides 096183 (H) 0 - 149 mg/dL   HDL 34 (L) >04>39 mg/dL     VLDL Cholesterol Cal 37 5 - 40 mg/dL   LDL Calculated 540150 (H) 0 - 99 mg/dL  TSH  Result Value Ref Range   TSH 1.760 0.450 - 4.500 uIU/mL  UA/M w/rflx Culture, Routine  Result Value Ref Range   Specific Gravity, UA 1.020 1.005 - 1.030   pH, UA 5.0 5.0 - 7.5   Color, UA Yellow Yellow   Appearance Ur Clear Clear   Leukocytes, UA Negative Negative   Protein, UA Negative Negative/Trace   Glucose, UA Negative Negative   Ketones, UA Negative Negative   RBC, UA Trace (A) Negative   Bilirubin, UA Negative Negative   Urobilinogen, Ur 0.2 0.2 - 1.0 mg/dL   Nitrite, UA Negative Negative   Microscopic Examination See below:       Assessment & Plan:   Problem List Items Addressed This Visit      Other   HSV infection    Pt wanting to continue with spot treatment for now, will refill valtrex for prn use. He will call if he decides he needs daily dosing      Relevant Medications   valACYclovir (VALTREX) 1000 MG tablet       Follow up plan: Return if symptoms worsen or fail to improve.

## 2018-07-16 IMAGING — CR DG HAND COMPLETE 3+V*R*
3 series · 3 of 3 positions shown · non-contrast
Comparison: None.

CLINICAL DATA: Posterior right hand pain following injury last
night.

EXAM:
RIGHT HAND - COMPLETE 3+ VIEW

[hand ap]
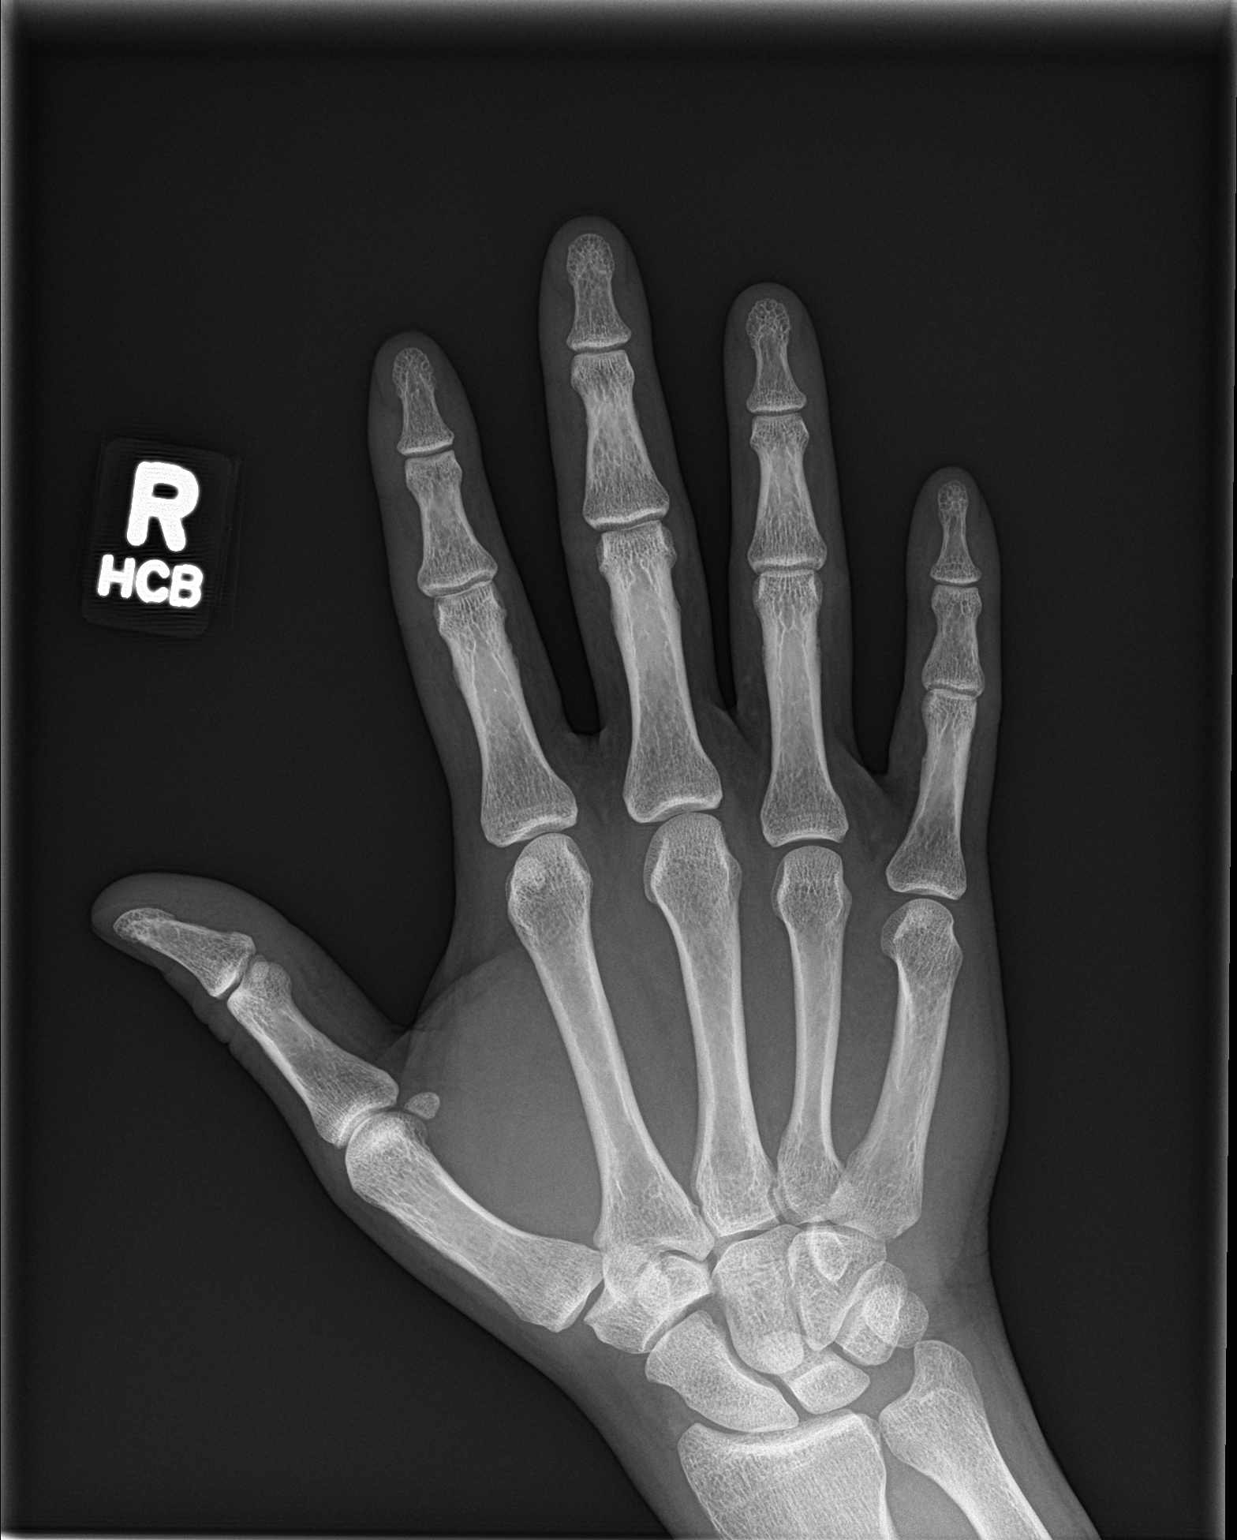

[hand obl]
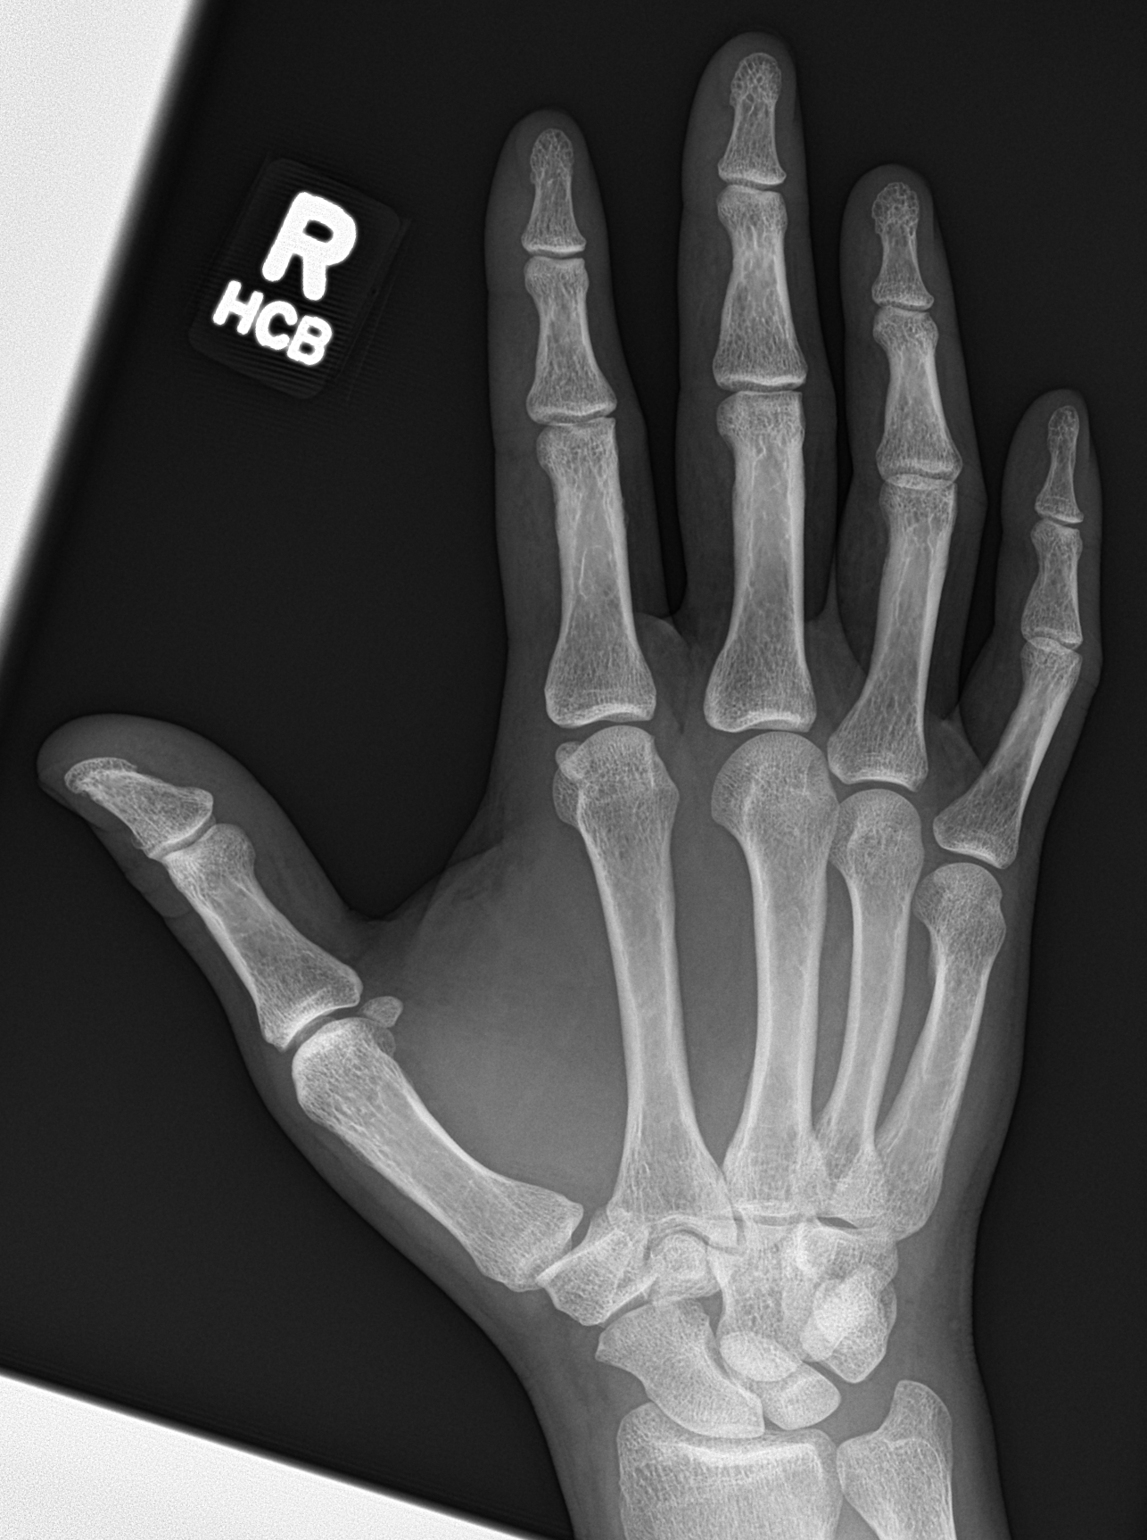

[hand lat]
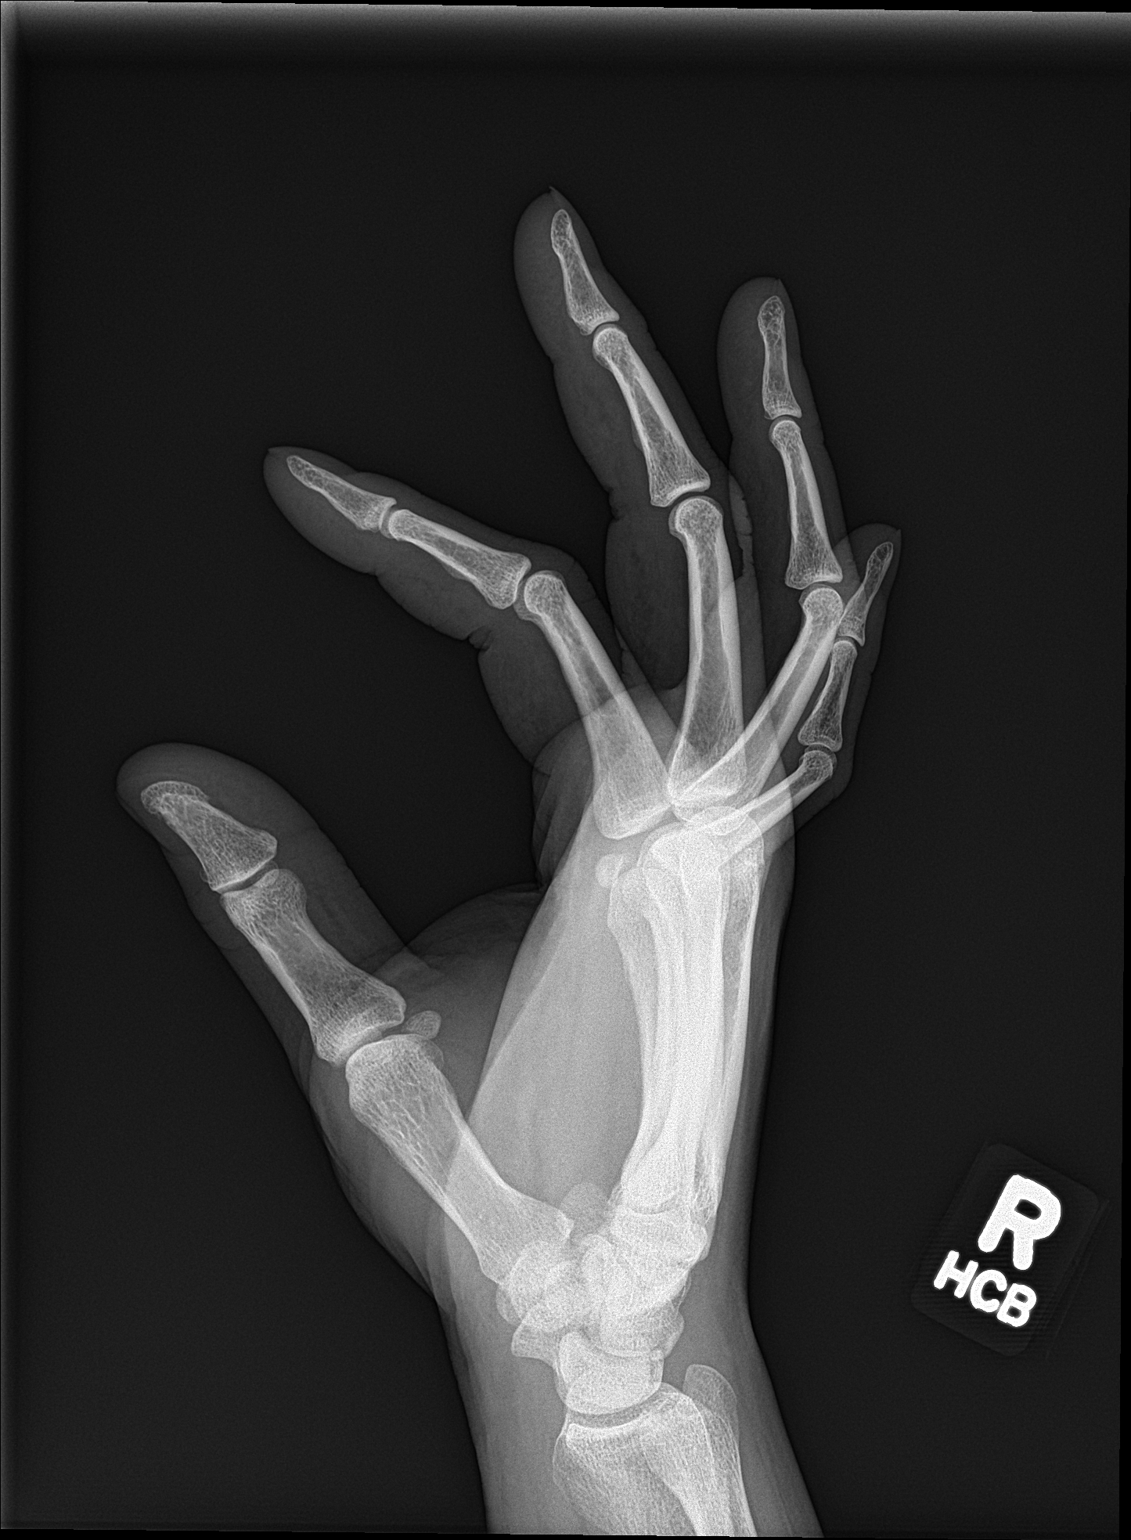

[3 of 3 positions shown; findings below may reference images not displayed]

FINDINGS: The mineralization and alignment are normal. There is no evidence of
acute fracture or dislocation. The joint spaces are maintained. No
focal soft tissue swelling or foreign body observed.
IMPRESSION: Normal examination.

## 2019-02-07 ENCOUNTER — Encounter: Payer: Self-pay | Admitting: Family Medicine

## 2019-02-09 ENCOUNTER — Other Ambulatory Visit: Payer: Self-pay

## 2019-02-09 ENCOUNTER — Encounter: Payer: Self-pay | Admitting: Family Medicine

## 2019-02-09 ENCOUNTER — Ambulatory Visit: Payer: No Typology Code available for payment source | Admitting: Family Medicine

## 2019-02-09 VITALS — BP 137/79 | HR 79 | Temp 98.3°F | Ht 73.0 in | Wt 285.0 lb

## 2019-02-09 DIAGNOSIS — B359 Dermatophytosis, unspecified: Secondary | ICD-10-CM | POA: Diagnosis not present

## 2019-02-09 MED ORDER — KETOCONAZOLE 2 % EX CREA: 1.0000 "application " | TOPICAL_CREAM | Freq: Two times a day (BID) | CUTANEOUS | 0 refills | Status: DC | PRN

## 2019-02-09 NOTE — Progress Notes (Signed)
BP 137/79 (BP Location: Right Arm, Patient Position: Sitting, Cuff Size: Normal)   Pulse 79   Temp 98.3 F (36.8 C) (Oral)   Ht 6\' 1"  (1.854 m)   Wt 285 lb (129.3 kg)   SpO2 96%   BMI 37.60 kg/m    Subjective:    Patient ID: Robert Jensen, male    DOB: 05/26/1981, 38 y.o.   MRN: [id number]  HPI: Robert Jensen is a 38 y.o. male  Chief Complaint  Patient presents with  . Rash    Right buttox. Noticed 2 weeks ago. Painful.    Patient presenting today with a rash on right buttock that he's noticed becoming worse the past 2 weeks or so. Now feels a raised border to it and some scaliness. No itching, but states it does burn. Using some kenalog cream which seemed to inflame things more. Denies new soap, foods, detergents or other exposures.   Relevant past medical, surgical, family and social history reviewed and updated as indicated. Interim medical history since our last visit reviewed. Allergies and medications reviewed and updated.  Review of Systems  Per HPI unless specifically indicated above     Objective:    BP 137/79 (BP Location: Right Arm, Patient Position: Sitting, Cuff Size: Normal)   Pulse 79   Temp 98.3 F (36.8 C) (Oral)   Ht 6\' 1"  (1.854 m)   Wt 285 lb (129.3 kg)   SpO2 96%   BMI 37.60 kg/m   Wt Readings from Last 3 Encounters:  02/09/19 285 lb (129.3 kg)  06/23/18 279 lb (126.6 kg)  05/20/18 279 lb (126.6 kg)    Physical Exam Vitals signs and nursing note reviewed.  Constitutional:      Appearance: Normal appearance.  HENT:     Head: Atraumatic.  Eyes:     Extraocular Movements: Extraocular movements intact.     Conjunctiva/sclera: Conjunctivae normal.  Neck:     Musculoskeletal: Normal range of motion and neck supple.  Cardiovascular:     Rate and Rhythm: Normal rate and regular rhythm.  Pulmonary:     Effort: Pulmonary effort is normal.     Breath sounds: Normal breath sounds.  Musculoskeletal: Normal range of motion.  Skin:     General: Skin is warm and dry.     Findings: Rash (Circular, scaly patch on right buttock with a raised erythematous border) present.  Neurological:     General: No focal deficit present.     Mental Status: He is oriented to person, place, and time.  Psychiatric:        Mood and Affect: Mood normal.        Thought Content: Thought content normal.        Judgment: Judgment normal.     Results for orders placed or performed in visit on 05/20/18  Microscopic Examination   URINE  Result Value Ref Range   WBC, UA 0-5 0 - 5 /hpf   RBC, UA 0-2 0 - 2 /hpf   Epithelial Cells (non renal) 0-10 0 - 10 /hpf   Bacteria, UA None seen None seen/Few  CBC with Differential/Platelet  Result Value Ref Range   WBC 5.8 3.4 - 10.8 x10E3/uL   RBC 4.99 4.14 - 5.80 x10E6/uL   Hemoglobin 16.0 13.0 - 17.7 g/dL   Hematocrit 40.144.2 02.737.5 - 51.0 %   MCV 89 79 - 97 fL   MCH 32.1 26.6 - 33.0 pg   MCHC 36.2 (H) 31.5 - 35.7 g/dL  RDW 13.6 12.3 - 15.4 %   Platelets 229 150 - 450 x10E3/uL   Neutrophils 46 Not Estab. %   Lymphs 35 Not Estab. %   Monocytes 13 Not Estab. %   Eos 5 Not Estab. %   Basos 1 Not Estab. %   Neutrophils Absolute 2.7 1.4 - 7.0 x10E3/uL   Lymphocytes Absolute 2.0 0.7 - 3.1 x10E3/uL   Monocytes Absolute 0.7 0.1 - 0.9 x10E3/uL   EOS (ABSOLUTE) 0.3 0.0 - 0.4 x10E3/uL   Basophils Absolute 0.0 0.0 - 0.2 x10E3/uL   Immature Granulocytes 0 Not Estab. %   Immature Grans (Abs) 0.0 0.0 - 0.1 x10E3/uL  Comprehensive metabolic panel  Result Value Ref Range   Glucose 91 65 - 99 mg/dL   BUN 9 6 - 20 mg/dL   Creatinine, Ser 0.79 0.76 - 1.27 mg/dL   GFR calc non Af Amer 115 >59 mL/min/1.73   GFR calc Af Amer 133 >59 mL/min/1.73   BUN/Creatinine Ratio 11 9 - 20   Sodium 140 134 - 144 mmol/L   Potassium 4.3 3.5 - 5.2 mmol/L   Chloride 105 96 - 106 mmol/L   CO2 21 20 - 29 mmol/L   Calcium 8.4 (L) 8.7 - 10.2 mg/dL   Total Protein 7.0 6.0 - 8.5 g/dL   Albumin 4.2 3.5 - 5.5 g/dL   Globulin,  Total 2.8 1.5 - 4.5 g/dL   Albumin/Globulin Ratio 1.5 1.2 - 2.2   Bilirubin Total 0.4 0.0 - 1.2 mg/dL   Alkaline Phosphatase 85 39 - 117 IU/L   AST 27 0 - 40 IU/L   ALT 48 (H) 0 - 44 IU/L  Lipid Panel w/o Chol/HDL Ratio  Result Value Ref Range   Cholesterol, Total 221 (H) 100 - 199 mg/dL   Triglycerides 183 (H) 0 - 149 mg/dL   HDL 34 (L) >39 mg/dL   VLDL Cholesterol Cal 37 5 - 40 mg/dL   LDL Calculated 150 (H) 0 - 99 mg/dL  TSH  Result Value Ref Range   TSH 1.760 0.450 - 4.500 uIU/mL  UA/M w/rflx Culture, Routine   Specimen: Urine   URINE  Result Value Ref Range   Specific Gravity, UA 1.020 1.005 - 1.030   pH, UA 5.0 5.0 - 7.5   Color, UA Yellow Yellow   Appearance Ur Clear Clear   Leukocytes, UA Negative Negative   Protein, UA Negative Negative/Trace   Glucose, UA Negative Negative   Ketones, UA Negative Negative   RBC, UA Trace (A) Negative   Bilirubin, UA Negative Negative   Urobilinogen, Ur 0.2 0.2 - 1.0 mg/dL   Nitrite, UA Negative Negative   Microscopic Examination See below:       Assessment & Plan:   Problem List Items Addressed This Visit    None    Visit Diagnoses    Tinea    -  Primary   Tx with ketoconazole cream, antifungal powders. Keep area clean and dry. F/u if not improving   Relevant Medications   ketoconazole (NIZORAL) 2 % cream       Follow up plan: Return if symptoms worsen or fail to improve.

## 2019-02-25 ENCOUNTER — Other Ambulatory Visit: Payer: Self-pay | Admitting: Family Medicine

## 2019-02-25 ENCOUNTER — Encounter: Payer: Self-pay | Admitting: Family Medicine

## 2019-02-28 MED ORDER — KETOCONAZOLE 2 % EX CREA: 1.0000 "application " | TOPICAL_CREAM | Freq: Two times a day (BID) | CUTANEOUS | 0 refills | Status: DC | PRN

## 2019-03-22 ENCOUNTER — Encounter: Payer: Self-pay | Admitting: Family Medicine

## 2019-03-23 ENCOUNTER — Other Ambulatory Visit: Payer: Self-pay | Admitting: Family Medicine

## 2019-03-23 MED ORDER — FLUCONAZOLE 150 MG PO TABS: 150.0000 mg | ORAL_TABLET | ORAL | 0 refills | Status: DC

## 2019-03-23 MED ORDER — KETOCONAZOLE 2 % EX CREA: 1.0000 "application " | TOPICAL_CREAM | Freq: Two times a day (BID) | CUTANEOUS | 0 refills | Status: DC | PRN

## 2019-05-23 ENCOUNTER — Other Ambulatory Visit: Payer: Self-pay

## 2019-05-23 ENCOUNTER — Ambulatory Visit (INDEPENDENT_AMBULATORY_CARE_PROVIDER_SITE_OTHER): Payer: No Typology Code available for payment source | Admitting: Family Medicine

## 2019-05-23 ENCOUNTER — Encounter: Payer: Self-pay | Admitting: Family Medicine

## 2019-05-23 VITALS — BP 124/77 | HR 66 | Temp 97.9°F | Ht 73.2 in | Wt 289.8 lb

## 2019-05-23 DIAGNOSIS — Z0000 Encounter for general adult medical examination without abnormal findings: Secondary | ICD-10-CM

## 2019-05-23 DIAGNOSIS — Z23 Encounter for immunization: Secondary | ICD-10-CM

## 2019-05-23 DIAGNOSIS — Z113 Encounter for screening for infections with a predominantly sexual mode of transmission: Secondary | ICD-10-CM

## 2019-05-23 LAB — UA/M W/RFLX CULTURE, ROUTINE
Bilirubin, UA: NEGATIVE
Glucose, UA: NEGATIVE
Ketones, UA: NEGATIVE
Leukocytes,UA: NEGATIVE
Nitrite, UA: NEGATIVE
Protein,UA: NEGATIVE
Specific Gravity, UA: 1.025 (ref 1.005–1.030)
Urobilinogen, Ur: 1 mg/dL (ref 0.2–1.0)
pH, UA: 5.5 (ref 5.0–7.5)

## 2019-05-23 LAB — MICROSCOPIC EXAMINATION
Bacteria, UA: NONE SEEN
WBC, UA: NONE SEEN /hpf (ref 0–5)

## 2019-05-23 NOTE — Progress Notes (Signed)
BP 124/77   Pulse 66   Temp 97.9 F (36.6 C) (Oral)   Ht 6' 1.2" (1.859 m)   Wt 289 lb 12.8 oz (131.5 kg)   SpO2 95%   BMI 38.03 kg/m    Subjective:    Patient ID: Robert Jensen, male    DOB: 02/23/1981, 38 y.o.   MRN: [id number]  HPI: Robert Jensen is a 38 y.o. male presenting on 05/23/2019 for comprehensive medical examination. Current medical complaints include:none  He currently lives with: Interim Problems from his last visit: no  Depression Screen done today and results listed below:  [facility] 05/23/2019 05/20/2018 05/07/2018  Decreased Interest 0 0 0  Down, Depressed, Hopeless 0 0 0  PHQ - 2 Score 0 0 0  Altered sleeping - 0 0  Tired, decreased energy - 0 0  Change in appetite - 0 0  Feeling bad or failure about yourself  - 0 0  Trouble concentrating - 0 0  Moving slowly or fidgety/restless - 0 0  Suicidal thoughts - 0 0  PHQ-9 Score - 0 0  Difficult doing work/chores - - Not difficult at all    The patient does not have a history of falls. I did not complete a risk assessment for falls. A plan of care for falls was not documented.   Past Medical History:  History reviewed. No pertinent past medical history.  Surgical History:  History reviewed. No pertinent surgical history.  Medications:  Current Outpatient Medications on File Prior to Visit  Medication Sig  . ketoconazole (NIZORAL) 2 % cream Apply 1 application topically 2 (two) times daily as needed for irritation.  . triamcinolone cream (KENALOG) 0.1 % Apply 1 application topically 2 (two) times daily.  . valACYclovir (VALTREX) 1000 MG tablet Take 1 tablet (1,000 mg total) by mouth 2 (two) times daily.   No current facility-administered medications on file prior to visit.     Allergies:  Allergies  Allergen Reactions  . Other Rash    Nitrile latex free gloves    Social History:  Social History   Socioeconomic History  . Marital status: Single    Spouse name:  Not on file  . Number of children: Not on file  . Years of education: Not on file  . Highest education level: Not on file  Occupational History  . Not on file  Social Needs  . Financial resource strain: Not on file  . Food insecurity    Worry: Not on file    Inability: Not on file  . Transportation needs    Medical: Not on file    Non-medical: Not on file  Tobacco Use  . Smoking status: Former Smoker    Quit date: 05/07/2010    Years since quitting: 9.0  . Smokeless tobacco: Former NeurosurgeonUser    Quit date: 05/07/1998  Substance and Sexual Activity  . Alcohol use: Yes    Alcohol/week: 20.0 standard drinks    Types: 20 Cans of beer per week  . Drug use: Never  . Sexual activity: Yes  Lifestyle  . Physical activity    Days per week: Not on file    Minutes per session: Not on file  . Stress: Not on file  Relationships  . Social Musicianconnections    Talks on phone: Not on file    Gets together: Not on file    Attends religious service: Not on file    Active member of club or organization: Not  on file    Attends meetings of clubs or organizations: Not on file    Relationship status: Not on file  . Intimate partner violence    Fear of current or ex partner: Not on file    Emotionally abused: Not on file    Physically abused: Not on file    Forced sexual activity: Not on file  Other Topics Concern  . Not on file  Social History Narrative  . Not on file   Social History   Tobacco Use  Smoking Status Former Smoker  . Quit date: 05/07/2010  . Years since quitting: 9.0  Smokeless Tobacco Former Neurosurgeon  . Quit date: 05/07/1998   Social History   Substance and Sexual Activity  Alcohol Use Yes  . Alcohol/week: 20.0 standard drinks  . Types: 20 Cans of beer per week    Family History:  Family History  Adopted: Yes    Past medical history, surgical history, medications, allergies, family history and social history reviewed with patient today and changes made to appropriate areas  of the chart.   Review of Systems - General ROS: negative Psychological ROS: negative Ophthalmic ROS: negative ENT ROS: negative Allergy and Immunology ROS: negative Hematological and Lymphatic ROS: negative Endocrine ROS: negative Breast ROS: negative for breast lumps Respiratory ROS: no cough, shortness of breath, or wheezing Cardiovascular ROS: no chest pain or dyspnea on exertion Gastrointestinal ROS: no abdominal pain, change in bowel habits, or black or bloody stools Genito-Urinary ROS: no dysuria, trouble voiding, or hematuria Musculoskeletal ROS: negative Neurological ROS: no TIA or stroke symptoms Dermatological ROS: negative All other ROS negative except what is listed above and in the HPI.      Objective:    BP 124/77   Pulse 66   Temp 97.9 F (36.6 C) (Oral)   Ht 6' 1.2" (1.859 m)   Wt 289 lb 12.8 oz (131.5 kg)   SpO2 95%   BMI 38.03 kg/m   Wt Readings from Last 3 Encounters:  05/23/19 289 lb 12.8 oz (131.5 kg)  02/09/19 285 lb (129.3 kg)  06/23/18 279 lb (126.6 kg)    Physical Exam  Results for orders placed or performed in visit on 05/20/18  Microscopic Examination   URINE  Result Value Ref Range   WBC, UA 0-5 0 - 5 /hpf   RBC, UA 0-2 0 - 2 /hpf   Epithelial Cells (non renal) 0-10 0 - 10 /hpf   Bacteria, UA None seen None seen/Few  CBC with Differential/Platelet  Result Value Ref Range   WBC 5.8 3.4 - 10.8 x10E3/uL   RBC 4.99 4.14 - 5.80 x10E6/uL   Hemoglobin 16.0 13.0 - 17.7 g/dL   Hematocrit 16.1 09.6 - 51.0 %   MCV 89 79 - 97 fL   MCH 32.1 26.6 - 33.0 pg   MCHC 36.2 (H) 31.5 - 35.7 g/dL   RDW 04.5 40.9 - 81.1 %   Platelets 229 150 - 450 x10E3/uL   Neutrophils 46 Not Estab. %   Lymphs 35 Not Estab. %   Monocytes 13 Not Estab. %   Eos 5 Not Estab. %   Basos 1 Not Estab. %   Neutrophils Absolute 2.7 1.4 - 7.0 x10E3/uL   Lymphocytes Absolute 2.0 0.7 - 3.1 x10E3/uL   Monocytes Absolute 0.7 0.1 - 0.9 x10E3/uL   EOS (ABSOLUTE) 0.3 0.0 - 0.4  x10E3/uL   Basophils Absolute 0.0 0.0 - 0.2 x10E3/uL   Immature Granulocytes 0 Not Estab. %  Immature Grans (Abs) 0.0 0.0 - 0.1 x10E3/uL  Comprehensive metabolic panel  Result Value Ref Range   Glucose 91 65 - 99 mg/dL   BUN 9 6 - 20 mg/dL   Creatinine, Ser 9.56 0.76 - 1.27 mg/dL   GFR calc non Af Amer 115 >59 mL/min/1.73   GFR calc Af Amer 133 >59 mL/min/1.73   BUN/Creatinine Ratio 11 9 - 20   Sodium 140 134 - 144 mmol/L   Potassium 4.3 3.5 - 5.2 mmol/L   Chloride 105 96 - 106 mmol/L   CO2 21 20 - 29 mmol/L   Calcium 8.4 (L) 8.7 - 10.2 mg/dL   Total Protein 7.0 6.0 - 8.5 g/dL   Albumin 4.2 3.5 - 5.5 g/dL   Globulin, Total 2.8 1.5 - 4.5 g/dL   Albumin/Globulin Ratio 1.5 1.2 - 2.2   Bilirubin Total 0.4 0.0 - 1.2 mg/dL   Alkaline Phosphatase 85 39 - 117 IU/L   AST 27 0 - 40 IU/L   ALT 48 (H) 0 - 44 IU/L  Lipid Panel w/o Chol/HDL Ratio  Result Value Ref Range   Cholesterol, Total 221 (H) 100 - 199 mg/dL   Triglycerides 387 (H) 0 - 149 mg/dL   HDL 34 (L) >56 mg/dL   VLDL Cholesterol Cal 37 5 - 40 mg/dL   LDL Calculated 433 (H) 0 - 99 mg/dL  TSH  Result Value Ref Range   TSH 1.760 0.450 - 4.500 uIU/mL  UA/M w/rflx Culture, Routine   Specimen: Urine   URINE  Result Value Ref Range   Specific Gravity, UA 1.020 1.005 - 1.030   pH, UA 5.0 5.0 - 7.5   Color, UA Yellow Yellow   Appearance Ur Clear Clear   Leukocytes, UA Negative Negative   Protein, UA Negative Negative/Trace   Glucose, UA Negative Negative   Ketones, UA Negative Negative   RBC, UA Trace (A) Negative   Bilirubin, UA Negative Negative   Urobilinogen, Ur 0.2 0.2 - 1.0 mg/dL   Nitrite, UA Negative Negative   Microscopic Examination See below:       Assessment & Plan:   Problem List Items Addressed This Visit    None    Visit Diagnoses    Annual physical exam    -  Primary   Relevant Orders   CBC with Differential/Platelet out   Comprehensive metabolic panel   Lipid Panel w/o Chol/HDL Ratio out   TSH    UA/M w/rflx Culture, Routine   Routine screening for STI (sexually transmitted infection)       Relevant Orders   HIV antibody   RPR   HSV(herpes simplex vrs) 1+2 ab-IgG   GC/Chlamydia Probe Amp       Discussed aspirin prophylaxis for myocardial infarction prevention and decision was it was not indicated  LABORATORY TESTING:  Health maintenance labs ordered today as discussed above.   The natural history of prostate cancer and ongoing controversy regarding screening and potential treatment outcomes of prostate cancer has been discussed with the patient. The meaning of a false positive PSA and a false negative PSA has been discussed. He indicates understanding of the limitations of this screening test and wishes not to proceed with screening PSA testing.   IMMUNIZATIONS:   - Tdap: Tetanus vaccination status reviewed: last tetanus booster within 10 years. - Influenza: Administered today  PATIENT COUNSELING:    Sexuality: Discussed sexually transmitted diseases, partner selection, use of condoms, avoidance of unintended pregnancy  and contraceptive alternatives.  Advised to avoid cigarette smoking.  I discussed with the patient that most people either abstain from alcohol or drink within safe limits (<=14/week and <=4 drinks/occasion for males, <=7/weeks and <= 3 drinks/occasion for females) and that the risk for alcohol disorders and other health effects rises proportionally with the number of drinks per week and how often a drinker exceeds daily limits.  Discussed cessation/primary prevention of drug use and availability of treatment for abuse.   Diet: Encouraged to adjust caloric intake to maintain  or achieve ideal body weight, to reduce intake of dietary saturated fat and total fat, to limit sodium intake by avoiding high sodium foods and not adding table salt, and to maintain adequate dietary potassium and calcium preferably from fresh fruits, vegetables, and low-fat dairy  products.    stressed the importance of regular exercise  Injury prevention: Discussed safety belts, safety helmets, smoke detector, smoking near bedding or upholstery.   Dental health: Discussed importance of regular tooth brushing, flossing, and dental visits.   Follow up plan: NEXT PREVENTATIVE PHYSICAL DUE IN 1 YEAR. Return in about 1 year (around 05/22/2020) for CPE.

## 2019-05-23 NOTE — Patient Instructions (Signed)

## 2019-05-24 ENCOUNTER — Encounter: Payer: Self-pay | Admitting: Family Medicine

## 2019-05-25 LAB — CBC WITH DIFFERENTIAL/PLATELET
Basophils Absolute: 0 10*3/uL (ref 0.0–0.2)
Basos: 0 %
EOS (ABSOLUTE): 0.2 10*3/uL (ref 0.0–0.4)
Eos: 4 %
Hematocrit: 46.5 % (ref 37.5–51.0)
Hemoglobin: 16 g/dL (ref 13.0–17.7)
Immature Grans (Abs): 0 10*3/uL (ref 0.0–0.1)
Immature Granulocytes: 0 %
Lymphocytes Absolute: 1.3 10*3/uL (ref 0.7–3.1)
Lymphs: 25 %
MCH: 30.4 pg (ref 26.6–33.0)
MCHC: 34.4 g/dL (ref 31.5–35.7)
MCV: 88 fL (ref 79–97)
Monocytes Absolute: 0.5 10*3/uL (ref 0.1–0.9)
Monocytes: 10 %
Neutrophils Absolute: 3.3 10*3/uL (ref 1.4–7.0)
Neutrophils: 61 %
Platelets: 220 10*3/uL (ref 150–450)
RBC: 5.27 x10E6/uL (ref 4.14–5.80)
RDW: 13.7 % (ref 11.6–15.4)
WBC: 5.4 10*3/uL (ref 3.4–10.8)

## 2019-05-25 LAB — COMPREHENSIVE METABOLIC PANEL
ALT: 60 IU/L — ABNORMAL HIGH (ref 0–44)
AST: 41 IU/L — ABNORMAL HIGH (ref 0–40)
Albumin/Globulin Ratio: 1.5 (ref 1.2–2.2)
Albumin: 4.4 g/dL (ref 4.0–5.0)
Alkaline Phosphatase: 92 IU/L (ref 39–117)
BUN/Creatinine Ratio: 13 (ref 9–20)
BUN: 10 mg/dL (ref 6–20)
Bilirubin Total: 1.1 mg/dL (ref 0.0–1.2)
CO2: 21 mmol/L (ref 20–29)
Calcium: 8.7 mg/dL (ref 8.7–10.2)
Chloride: 101 mmol/L (ref 96–106)
Creatinine, Ser: 0.76 mg/dL (ref 0.76–1.27)
GFR calc Af Amer: 134 mL/min/{1.73_m2} (ref >59)
GFR calc non Af Amer: 116 mL/min/{1.73_m2} (ref >59)
Globulin, Total: 2.9 g/dL (ref 1.5–4.5)
Glucose: 103 mg/dL — ABNORMAL HIGH (ref 65–99)
Potassium: 4.1 mmol/L (ref 3.5–5.2)
Sodium: 137 mmol/L (ref 134–144)
Total Protein: 7.3 g/dL (ref 6.0–8.5)

## 2019-05-25 LAB — LIPID PANEL W/O CHOL/HDL RATIO
Cholesterol, Total: 227 mg/dL — ABNORMAL HIGH (ref 100–199)
HDL: 34 mg/dL — ABNORMAL LOW (ref >39)
LDL Chol Calc (NIH): 170 mg/dL — ABNORMAL HIGH (ref 0–99)
Triglycerides: 124 mg/dL (ref 0–149)
VLDL Cholesterol Cal: 23 mg/dL (ref 5–40)

## 2019-05-25 LAB — HIV ANTIBODY (ROUTINE TESTING W REFLEX): HIV Screen 4th Generation wRfx: NONREACTIVE

## 2019-05-25 LAB — RPR: RPR Ser Ql: NONREACTIVE

## 2019-05-25 LAB — TSH: TSH: 2.16 u[IU]/mL (ref 0.450–4.500)

## 2019-05-25 LAB — HSV(HERPES SIMPLEX VRS) I + II AB-IGG
HSV 1 Glycoprotein G Ab, IgG: 43.1 index — ABNORMAL HIGH (ref 0.00–0.90)
HSV 2 IgG, Type Spec: <0.91 index (ref 0.00–0.90)

## 2019-05-27 LAB — GC/CHLAMYDIA PROBE AMP
Chlamydia trachomatis, NAA: NEGATIVE
Neisseria Gonorrhoeae by PCR: NEGATIVE

## 2019-07-13 ENCOUNTER — Encounter: Payer: Self-pay | Admitting: Family Medicine

## 2019-07-14 ENCOUNTER — Ambulatory Visit (INDEPENDENT_AMBULATORY_CARE_PROVIDER_SITE_OTHER): Payer: No Typology Code available for payment source | Admitting: Family Medicine

## 2019-07-14 ENCOUNTER — Encounter: Payer: Self-pay | Admitting: Family Medicine

## 2019-07-14 ENCOUNTER — Other Ambulatory Visit: Payer: Self-pay

## 2019-07-14 VITALS — Temp 98.5°F | Wt 289.0 lb

## 2019-07-14 DIAGNOSIS — B356 Tinea cruris: Secondary | ICD-10-CM

## 2019-07-14 DIAGNOSIS — B379 Candidiasis, unspecified: Secondary | ICD-10-CM

## 2019-07-14 DIAGNOSIS — N529 Male erectile dysfunction, unspecified: Secondary | ICD-10-CM | POA: Diagnosis not present

## 2019-07-14 MED ORDER — SILDENAFIL CITRATE 20 MG PO TABS: ORAL_TABLET | ORAL | 3 refills | Status: DC

## 2019-07-14 MED ORDER — FLUCONAZOLE 150 MG PO TABS: 150.0000 mg | ORAL_TABLET | Freq: Every day | ORAL | 0 refills | Status: DC

## 2019-07-14 NOTE — Progress Notes (Signed)
Temp 98.5 F (36.9 C) (Oral)   Wt 289 lb (131.1 kg)   BMI 37.92 kg/m    Subjective:    Patient ID: Robert Jensen, male    DOB: 06-02-1981, 39 y.o.   MRN: [id number]  HPI: Robert Jensen is a 39 y.o. male  Chief Complaint  Patient presents with  . Rash    bilateral groin itching for over a month. has tried OCT cream and spray, not helping. [facility]  . Other    pt requested a RX sildenafil. [facility]    . This visit was completed via WebEx due to the restrictions of the COVID-19 pandemic. All issues as above were discussed and addressed. Physical exam was done as above through visual confirmation on WebEx. If it was felt that the patient should be evaluated in the office, they were directed there. The patient verbally consented to this visit. . Location of the patient: home . Location of the provider: work . Those involved with this call:  . Provider: Roosvelt Maser, PA-C . CMA: Elton Sin, CMA . Front Desk/Registration: Harriet Pho  . Time spent on call: 20 minutes with patient face to face via video conference. More than 50% of this time was spent in counseling and coordination of care. 5 minutes total spent in review of patient's record and preparation of their chart. I verified patient identity using two factors (patient name and date of birth). Patient consents verbally to being seen via telemedicine visit today.   Having some groin rash and itching, using OTC creams and sprays the past month without much relief. Does tend to sweat a lot in his work uniform. Tries to wear underwear that helps keep things dry but does not seem to be helping. No exposures to STIs, genital sxs including penile discharge, dysuria, hematuria, abdominal pain.   Had a prescription for sildenafil about 10 years ago but could not afford the medication. Has ED issues every now and again and has found a pharmacy that the medication would be affordable from. Had 100 mg dose at  the time but that was too strong. Half dose was too strong as well. Would like new script sent for him to take prn.   Relevant past medical, surgical, family and social history reviewed and updated as indicated. Interim medical history since our last visit reviewed. Allergies and medications reviewed and updated.  Review of Systems  Per HPI unless specifically indicated above     Objective:    Temp 98.5 F (36.9 C) (Oral)   Wt 289 lb (131.1 kg)   BMI 37.92 kg/m   Wt Readings from Last 3 Encounters:  07/14/19 289 lb (131.1 kg)  05/23/19 289 lb 12.8 oz (131.5 kg)  02/09/19 285 lb (129.3 kg)    Physical Exam Vitals and nursing note reviewed.  Constitutional:      General: He is not in acute distress.    Appearance: Normal appearance.  HENT:     Head: Atraumatic.     Right Ear: External ear normal.     Left Ear: External ear normal.     Nose: Nose normal. No congestion.     Mouth/Throat:     Mouth: Mucous membranes are moist.     Pharynx: Oropharynx is clear.  Eyes:     Extraocular Movements: Extraocular movements intact.     Conjunctiva/sclera: Conjunctivae normal.  Cardiovascular:     Rate and Rhythm: Normal rate and regular rhythm.  Pulmonary:     Effort:  Pulmonary effort is normal. No respiratory distress.  Musculoskeletal:        General: Normal range of motion.     Cervical back: Normal range of motion.  Skin:    General: Skin is dry.     Findings: No erythema.     Comments: Unable to visualize groin rash due to sensitive nature of location and video visit platform today  Neurological:     Mental Status: He is oriented to person, place, and time.  Psychiatric:        Mood and Affect: Mood normal.        Thought Content: Thought content normal.        Judgment: Judgment normal.     Results for orders placed or performed in visit on 05/23/19  GC/Chlamydia Probe Amp   Specimen: Urine   UR  Result Value Ref Range   Chlamydia trachomatis, NAA Negative  Negative   Neisseria Gonorrhoeae by PCR Negative Negative  Microscopic Examination   URINE  Result Value Ref Range   WBC, UA None seen 0 - 5 /hpf   RBC 3-10 (A) 0 - 2 /hpf   Epithelial Cells (non renal) 0-10 0 - 10 /hpf   Mucus, UA Present Not Estab.   Bacteria, UA None seen None seen/Few  CBC with Differential/Platelet out  Result Value Ref Range   WBC 5.4 3.4 - 10.8 x10E3/uL   RBC 5.27 4.14 - 5.80 x10E6/uL   Hemoglobin 16.0 13.0 - 17.7 g/dL   Hematocrit 46.5 37.5 - 51.0 %   MCV 88 79 - 97 fL   MCH 30.4 26.6 - 33.0 pg   MCHC 34.4 31.5 - 35.7 g/dL   RDW 13.7 11.6 - 15.4 %   Platelets 220 150 - 450 x10E3/uL   Neutrophils 61 Not Estab. %   Lymphs 25 Not Estab. %   Monocytes 10 Not Estab. %   Eos 4 Not Estab. %   Basos 0 Not Estab. %   Neutrophils Absolute 3.3 1.4 - 7.0 x10E3/uL   Lymphocytes Absolute 1.3 0.7 - 3.1 x10E3/uL   Monocytes Absolute 0.5 0.1 - 0.9 x10E3/uL   EOS (ABSOLUTE) 0.2 0.0 - 0.4 x10E3/uL   Basophils Absolute 0.0 0.0 - 0.2 x10E3/uL   Immature Granulocytes 0 Not Estab. %   Immature Grans (Abs) 0.0 0.0 - 0.1 x10E3/uL  Comprehensive metabolic panel  Result Value Ref Range   Glucose 103 (H) 65 - 99 mg/dL   BUN 10 6 - 20 mg/dL   Creatinine, Ser 0.76 0.76 - 1.27 mg/dL   GFR calc non Af Amer 116 >59 mL/min/1.73   GFR calc Af Amer 134 >59 mL/min/1.73   BUN/Creatinine Ratio 13 9 - 20   Sodium 137 134 - 144 mmol/L   Potassium 4.1 3.5 - 5.2 mmol/L   Chloride 101 96 - 106 mmol/L   CO2 21 20 - 29 mmol/L   Calcium 8.7 8.7 - 10.2 mg/dL   Total Protein 7.3 6.0 - 8.5 g/dL   Albumin 4.4 4.0 - 5.0 g/dL   Globulin, Total 2.9 1.5 - 4.5 g/dL   Albumin/Globulin Ratio 1.5 1.2 - 2.2   Bilirubin Total 1.1 0.0 - 1.2 mg/dL   Alkaline Phosphatase 92 39 - 117 IU/L   AST 41 (H) 0 - 40 IU/L   ALT 60 (H) 0 - 44 IU/L  Lipid Panel w/o Chol/HDL Ratio out  Result Value Ref Range   Cholesterol, Total 227 (H) 100 - 199 mg/dL   Triglycerides 124  0 - 149 mg/dL   HDL 34 (L) >19 mg/dL     VLDL Cholesterol Cal 23 5 - 40 mg/dL   LDL Chol Calc (NIH) 147 (H) 0 - 99 mg/dL  TSH  Result Value Ref Range   TSH 2.160 0.450 - 4.500 uIU/mL  UA/M w/rflx Culture, Routine   Specimen: Urine   URINE  Result Value Ref Range   Specific Gravity, UA 1.025 1.005 - 1.030   pH, UA 5.5 5.0 - 7.5   Color, UA Yellow Yellow   Appearance Ur Clear Clear   Leukocytes,UA Negative Negative   Protein,UA Negative Negative/Trace   Glucose, UA Negative Negative   Ketones, UA Negative Negative   RBC, UA 2+ (A) Negative   Bilirubin, UA Negative Negative   Urobilinogen, Ur 1.0 0.2 - 1.0 mg/dL   Nitrite, UA Negative Negative   Microscopic Examination See below:   HIV antibody  Result Value Ref Range   HIV Screen 4th Generation wRfx Non Reactive Non Reactive  RPR  Result Value Ref Range   RPR Ser Ql Non Reactive Non Reactive  HSV(herpes simplex vrs) 1+2 ab-IgG  Result Value Ref Range   HSV 1 Glycoprotein G Ab, IgG 43.10 (H) 0.00 - 0.90 index   HSV 2 IgG, Type Spec <0.91 0.00 - 0.90 index      Assessment & Plan:   Problem List Items Addressed This Visit    None    Visit Diagnoses    Tinea cruris    -  Primary   Diflucan sent, can use ketoconazole cream prn. Unscented deodorant, powders, change clothes when sweaty. Recheck glucose given numerous yeast infections   Relevant Medications   fluconazole (DIFLUCAN) 150 MG tablet   Other Relevant Orders   HgB A1c   Erectile dysfunction, unspecified erectile dysfunction type       Low dose sildenafil sent to [facility] pharmacy per request for prn use.    Infection due to yeast       Relevant Medications   fluconazole (DIFLUCAN) 150 MG tablet   Other Relevant Orders   Basic metabolic panel   HgB A1c       Follow up plan: Return if symptoms worsen or fail to improve.

## 2019-07-18 ENCOUNTER — Encounter: Payer: Self-pay | Admitting: Family Medicine

## 2019-07-26 ENCOUNTER — Other Ambulatory Visit: Payer: Self-pay

## 2019-07-26 ENCOUNTER — Other Ambulatory Visit: Payer: No Typology Code available for payment source

## 2019-07-26 DIAGNOSIS — B356 Tinea cruris: Secondary | ICD-10-CM

## 2019-07-26 DIAGNOSIS — B379 Candidiasis, unspecified: Secondary | ICD-10-CM

## 2019-07-27 LAB — BASIC METABOLIC PANEL
BUN/Creatinine Ratio: 11 (ref 9–20)
BUN: 9 mg/dL (ref 6–20)
CO2: 23 mmol/L (ref 20–29)
Calcium: 9.3 mg/dL (ref 8.7–10.2)
Chloride: 101 mmol/L (ref 96–106)
Creatinine, Ser: 0.84 mg/dL (ref 0.76–1.27)
GFR calc Af Amer: 128 mL/min/{1.73_m2} (ref >59)
GFR calc non Af Amer: 111 mL/min/{1.73_m2} (ref >59)
Glucose: 91 mg/dL (ref 65–99)
Potassium: 4.4 mmol/L (ref 3.5–5.2)
Sodium: 139 mmol/L (ref 134–144)

## 2019-07-27 LAB — HEMOGLOBIN A1C
Est. average glucose Bld gHb Est-mCnc: 117 mg/dL
Hgb A1c MFr Bld: 5.7 % — ABNORMAL HIGH (ref 4.8–5.6)

## 2019-07-28 ENCOUNTER — Other Ambulatory Visit: Payer: Self-pay | Admitting: Family Medicine

## 2019-07-28 NOTE — Telephone Encounter (Signed)
Last OV 07/18/19

## 2019-07-29 ENCOUNTER — Ambulatory Visit: Payer: No Typology Code available for payment source | Admitting: Nurse Practitioner

## 2019-07-29 ENCOUNTER — Encounter: Payer: Self-pay | Admitting: Nurse Practitioner

## 2019-07-29 ENCOUNTER — Encounter: Payer: Self-pay | Admitting: Family Medicine

## 2019-07-29 ENCOUNTER — Other Ambulatory Visit: Payer: Self-pay

## 2019-07-29 VITALS — BP 147/84 | HR 64 | Temp 98.3°F

## 2019-07-29 DIAGNOSIS — M545 Low back pain, unspecified: Secondary | ICD-10-CM | POA: Insufficient documentation

## 2019-07-29 MED ORDER — KETOCONAZOLE 2 % EX CREA: 1.0000 "application " | TOPICAL_CREAM | Freq: Two times a day (BID) | CUTANEOUS | 0 refills | Status: DC | PRN

## 2019-07-29 MED ORDER — KETOROLAC TROMETHAMINE 60 MG/2ML IM SOLN
60.0000 mg | Freq: Once | INTRAMUSCULAR | Status: AC
  Administered 2019-07-29: 60 mg via INTRAMUSCULAR

## 2019-07-29 MED ORDER — CYCLOBENZAPRINE HCL 10 MG PO TABS: 10.0000 mg | ORAL_TABLET | Freq: Three times a day (TID) | ORAL | 0 refills | Status: DC | PRN

## 2019-07-29 NOTE — Assessment & Plan Note (Addendum)
Acute after twisting and pulling lower back last night to catch fall.  Decreased ROM and pain noted in office + muscle spasms.  No trauma to area, will defer imaging at this time.  Toradol injection in office today.  Script for Flexeril sent, recommend not to drive with this.  Note to be off work over weekend.  Recommend rest and alternate heat/ice over weekend + may alternate Motrin and Tylenol for pain as needed.  Use of Biofreeze and Lidocaine patches OTC.  Discussed symptoms to immediately go to ER for.  Return to office for worsening or continued pain.

## 2019-07-29 NOTE — Patient Instructions (Signed)
Acute Back Pain, Adult Acute back pain is sudden and usually short-lived. It is often caused by an injury to the muscles and tissues in the back. The injury may result from:  A muscle or ligament getting overstretched or torn (strained). Ligaments are tissues that connect bones to each other. Lifting something improperly can cause a back strain.  Wear and tear (degeneration) of the spinal disks. Spinal disks are circular tissue that provides cushioning between the bones of the spine (vertebrae).  Twisting motions, such as while playing sports or doing yard work.  A hit to the back.  Arthritis. You may have a physical exam, lab tests, and imaging tests to find the cause of your pain. Acute back pain usually goes away with rest and home care. Follow these instructions at home: Managing pain, stiffness, and swelling  Take over-the-counter and prescription medicines only as told by your health care provider.  Your health care provider may recommend applying ice during the first 24-48 hours after your pain starts. To do this: ? Put ice in a plastic bag. ? Place a towel between your skin and the bag. ? Leave the ice on for 20 minutes, 2-3 times a day.  If directed, apply heat to the affected area as often as told by your health care provider. Use the heat source that your health care provider recommends, such as a moist heat pack or a heating pad. ? Place a towel between your skin and the heat source. ? Leave the heat on for 20-30 minutes. ? Remove the heat if your skin turns bright red. This is especially important if you are unable to feel pain, heat, or cold. You have a greater risk of getting burned. Activity   Do not stay in bed. Staying in bed for more than 1-2 days can delay your recovery.  Sit up and stand up straight. Avoid leaning forward when you sit, or hunching over when you stand. ? If you work at a desk, sit close to it so you do not need to lean over. Keep your chin tucked  in. Keep your neck drawn back, and keep your elbows bent at a right angle. Your arms should look like the letter "L." ? Sit high and close to the steering wheel when you drive. Add lower back (lumbar) support to your car seat, if needed.  Take short walks on even surfaces as soon as you are able. Try to increase the length of time you walk each day.  Do not sit, drive, or stand in one place for more than 30 minutes at a time. Sitting or standing for long periods of time can put stress on your back.  Do not drive or use heavy machinery while taking prescription pain medicine.  Use proper lifting techniques. When you bend and lift, use positions that put less stress on your back: ? Bend your knees. ? Keep the load close to your body. ? Avoid twisting.  Exercise regularly as told by your health care provider. Exercising helps your back heal faster and helps prevent back injuries by keeping muscles strong and flexible.  Work with a physical therapist to make a safe exercise program, as recommended by your health care provider. Do any exercises as told by your physical therapist. Lifestyle  Maintain a healthy weight. Extra weight puts stress on your back and makes it difficult to have good posture.  Avoid activities or situations that make you feel anxious or stressed. Stress and anxiety increase muscle   tension and can make back pain worse. Learn ways to manage anxiety and stress, such as through exercise. General instructions  Sleep on a firm mattress in a comfortable position. Try lying on your side with your knees slightly bent. If you lie on your back, put a pillow under your knees.  Follow your treatment plan as told by your health care provider. This may include: ? Cognitive or behavioral therapy. ? Acupuncture or massage therapy. ? Meditation or yoga. Contact a health care provider if:  You have pain that is not relieved with rest or medicine.  You have increasing pain going down  into your legs or buttocks.  Your pain does not improve after 2 weeks.  You have pain at night.  You lose weight without trying.  You have a fever or chills. Get help right away if:  You develop new bowel or bladder control problems.  You have unusual weakness or numbness in your arms or legs.  You develop nausea or vomiting.  You develop abdominal pain.  You feel faint. Summary  Acute back pain is sudden and usually short-lived.  Use proper lifting techniques. When you bend and lift, use positions that put less stress on your back.  Take over-the-counter and prescription medicines and apply heat or ice as directed by your health care provider. This information is not intended to replace advice given to you by your health care provider. Make sure you discuss any questions you have with your health care provider. Document Revised: 10/12/2018 Document Reviewed: 02/04/2017 Elsevier Patient Education  2020 [employer]  

## 2019-07-29 NOTE — Progress Notes (Signed)
BP (!) 147/84 (BP Location: Right Arm, Patient Position: Sitting, Cuff Size: Normal)   Pulse 64   Temp 98.3 F (36.8 C) (Oral)   SpO2 99%    Subjective:    Patient ID: Robert Jensen, male    DOB: [birth year], 39 y.o.   MRN: [id number]  HPI: Robert Jensen is a 39 y.o. male  Chief Complaint  Patient presents with  . Back Pain    last night, missed step, pulled trying to catch self. Took Tylenol at 8am.   BACK PAIN He was going out door last night, missed bottom step, and was able to catch self before landing on back, but did pull muscle lower back when he twisted to catch fall.  Currently hurting in lower back.  When fell did not hit back and any extremities against anything, no trauma, was able to catch self.   Duration: days Mechanism of injury: trauma Location: bilateral and low back Onset: gradual Severity: 8/10 Quality: sharp, dull, aching and throbbing Frequency: constant Radiation: radiating down back of both legs Aggravating factors: lifting, movement and bending Alleviating factors: Biofreeze, Tylenol, Motrin and heat Status: worse Treatments attempted: tylenol, Motrin, heat Relief with NSAIDs?: no Nighttime pain:  no Paresthesias / decreased sensation:  no Bowel / bladder incontinence:  no Fevers:  no Dysuria / urinary frequency:  no  Relevant past medical, surgical, family and social history reviewed and updated as indicated. Interim medical history since our last visit reviewed. Allergies and medications reviewed and updated.  Review of Systems  Constitutional: Negative for activity change, diaphoresis, fatigue and fever.  Respiratory: Negative for cough, chest tightness, shortness of breath and wheezing.   Cardiovascular: Negative for chest pain, palpitations and leg swelling.  Musculoskeletal: Positive for back pain.  Neurological: Negative for dizziness, weakness, light-headedness, numbness and headaches.    Per HPI unless specifically  indicated above     Objective:    BP (!) 147/84 (BP Location: Right Arm, Patient Position: Sitting, Cuff Size: Normal)   Pulse 64   Temp 98.3 F (36.8 C) (Oral)   SpO2 99%   Wt Readings from Last 3 Encounters:  07/14/19 289 lb (131.1 kg)  05/23/19 289 lb 12.8 oz (131.5 kg)  02/09/19 285 lb (129.3 kg)    Physical Exam Vitals and nursing note reviewed.  Constitutional:      General: He is awake. He is not in acute distress.    Appearance: He is well-developed. He is obese. He is not ill-appearing.  HENT:     Head: Normocephalic and atraumatic.     Right Ear: Hearing normal. No drainage.     Left Ear: Hearing normal. No drainage.  Eyes:     General: Lids are normal.        Right eye: No discharge.        Left eye: No discharge.     Conjunctiva/sclera: Conjunctivae normal.     Pupils: Pupils are equal, round, and reactive to light.  Neck:     Vascular: No carotid bruit.  Cardiovascular:     Rate and Rhythm: Normal rate and regular rhythm.     Heart sounds: Normal heart sounds, S1 normal and S2 normal. No murmur. No gallop.   Pulmonary:     Effort: Pulmonary effort is normal. No accessory muscle usage or respiratory distress.     Breath sounds: Normal breath sounds.  Abdominal:     General: Bowel sounds are normal.     Palpations: Abdomen is soft.  Musculoskeletal:     Cervical back: Normal range of motion and neck supple.     Lumbar back: Spasms and tenderness (to lower left back with spasm) present. No swelling, edema, signs of trauma or lacerations. Decreased range of motion.     Right lower leg: No edema.     Left lower leg: No edema.  Skin:    General: Skin is warm and dry.  Neurological:     Mental Status: He is alert and oriented to person, place, and time.     Deep Tendon Reflexes: Reflexes are normal and symmetric.     Reflex Scores:      Brachioradialis reflexes are 2+ on the right side and 2+ on the left side.      Patellar reflexes are 2+ on the right side  and 2+ on the left side. Psychiatric:        Mood and Affect: Mood normal.        Behavior: Behavior normal. Behavior is cooperative.        Thought Content: Thought content normal.        Judgment: Judgment normal.    Back Exam:    Inspection:  Normal spinal curvature.  No deformity, ecchymosis, erythema, or lesions   Curvature: Normal   Deformity: no  Ecchymosis: no none  Erythema:  no none  Lesions: no    Palpation:     Midline spinal tenderness: no none                  Paralumbar tenderness: yes Left     Parathoracic tenderness: no none     Buttocks tenderness: none     Range of Motion:      Flexion: Fingers to Knees     Extension:Decreased     Lateral bending:Decreased    Rotation:Decreased    Neuro Exam:Lower extremity DTRs normal & symmetric.  Strength and sensation intact.     Patellar DTRs: normal     Ankle dorsiflexion strength: normal  Unable to perform straight leg testing due to patient discomfort with spasm lower back.  Results for orders placed or performed in visit on 07/26/19  HgB A1c  Result Value Ref Range   Hgb A1c MFr Bld 5.7 (H) 4.8 - 5.6 %   Est. average glucose Bld gHb Est-mCnc 117 mg/dL  Basic metabolic panel  Result Value Ref Range   Glucose 91 65 - 99 mg/dL   BUN 9 6 - 20 mg/dL   Creatinine, Ser 3.14 0.76 - 1.27 mg/dL   GFR calc non Af Amer 111 >59 mL/min/1.73   GFR calc Af Amer 128 >59 mL/min/1.73   BUN/Creatinine Ratio 11 9 - 20   Sodium 139 134 - 144 mmol/L   Potassium 4.4 3.5 - 5.2 mmol/L   Chloride 101 96 - 106 mmol/L   CO2 23 20 - 29 mmol/L   Calcium 9.3 8.7 - 10.2 mg/dL      Assessment & Plan:   Problem List Items Addressed This Visit      Other   Acute bilateral low back pain without sciatica - Primary    Acute after twisting and pulling lower back last night to catch fall.  Decreased ROM and pain noted in office + muscle spasms.  No trauma to area, will defer imaging at this time.  Toradol injection in office today.  Script  for Flexeril sent, recommend not to drive with this.  Note to be off work over weekend.  Recommend rest  and alternate heat/ice over weekend + may alternate Motrin and Tylenol for pain as needed.  Use of Biofreeze and Lidocaine patches OTC.  Discussed symptoms to immediately go to ER for.  Return to office for worsening or continued pain.      Relevant Medications   cyclobenzaprine (FLEXERIL) 10 MG tablet       Follow up plan: Return if symptoms worsen or fail to improve.

## 2019-08-15 ENCOUNTER — Other Ambulatory Visit: Payer: Self-pay | Admitting: Family Medicine

## 2019-08-15 ENCOUNTER — Encounter: Payer: Self-pay | Admitting: Family Medicine

## 2019-09-16 ENCOUNTER — Other Ambulatory Visit: Payer: Self-pay | Admitting: Family Medicine

## 2019-09-16 MED ORDER — KETOCONAZOLE 2 % EX CREA: 1.0000 "application " | TOPICAL_CREAM | Freq: Two times a day (BID) | CUTANEOUS | 0 refills | Status: DC | PRN

## 2019-09-16 NOTE — Telephone Encounter (Signed)
LOV 06/07/2020

## 2019-09-18 ENCOUNTER — Encounter: Payer: Self-pay | Admitting: Family Medicine

## 2019-09-19 MED ORDER — SILDENAFIL CITRATE 20 MG PO TABS: ORAL_TABLET | ORAL | 12 refills | Status: DC

## 2019-12-09 ENCOUNTER — Other Ambulatory Visit: Payer: Self-pay

## 2019-12-09 MED ORDER — KETOCONAZOLE 2 % EX CREA: 1.0000 "application " | TOPICAL_CREAM | Freq: Two times a day (BID) | CUTANEOUS | 0 refills | Status: DC | PRN

## 2019-12-09 NOTE — Telephone Encounter (Signed)
Routing to provider  

## 2020-04-16 ENCOUNTER — Ambulatory Visit: Payer: No Typology Code available for payment source | Admitting: Nurse Practitioner

## 2020-04-27 ENCOUNTER — Ambulatory Visit (INDEPENDENT_AMBULATORY_CARE_PROVIDER_SITE_OTHER): Payer: No Typology Code available for payment source | Admitting: Nurse Practitioner

## 2020-04-27 ENCOUNTER — Encounter: Payer: Self-pay | Admitting: Nurse Practitioner

## 2020-04-27 ENCOUNTER — Other Ambulatory Visit: Payer: Self-pay

## 2020-04-27 VITALS — BP 132/86 | HR 86 | Temp 98.6°F | Resp 16 | Ht 73.0 in | Wt 276.0 lb

## 2020-04-27 DIAGNOSIS — Z1159 Encounter for screening for other viral diseases: Secondary | ICD-10-CM | POA: Diagnosis not present

## 2020-04-27 DIAGNOSIS — Z1329 Encounter for screening for other suspected endocrine disorder: Secondary | ICD-10-CM

## 2020-04-27 DIAGNOSIS — Z13220 Encounter for screening for lipoid disorders: Secondary | ICD-10-CM

## 2020-04-27 DIAGNOSIS — Z0000 Encounter for general adult medical examination without abnormal findings: Secondary | ICD-10-CM

## 2020-04-27 NOTE — Progress Notes (Signed)
BP 132/86 (BP Location: Left Arm, Patient Position: Sitting, Cuff Size: Normal)   Pulse 86   Temp 98.6 F (37 C) (Oral)   Resp 16   Ht 6\' 1"  (1.854 m)   Wt 276 lb (125.2 kg)   SpO2 97%   BMI 36.41 kg/m    Subjective:    Patient ID: Robert Jensen, male    DOB: Sep 01, 1980, 39 y.o.   MRN: [id number]  HPI: Robert Jensen is a 39 y.o. male presenting on 04/27/2020 for comprehensive medical examination. Current medical complaints include:none  He currently lives with: significant other Interim Problems from his last visit: no  Functional Status Survey: Is the patient deaf or have difficulty hearing?: No Does the patient have difficulty seeing, even when wearing glasses/contacts?: No Does the patient have difficulty concentrating, remembering, or making decisions?: No Does the patient have difficulty walking or climbing stairs?: No Does the patient have difficulty dressing or bathing?: No Does the patient have difficulty doing errands alone such as visiting a doctor's office or shopping?: No  FALL RISK: Fall Risk  04/27/2020 05/23/2019 05/07/2018  Falls in the past year? 0 0 0  Number falls in past yr: 0 0 -  Injury with Fall? 0 0 -  Risk for fall due to : No Fall Risks - -  Follow up Falls evaluation completed Falls evaluation completed -    Depression Screen Depression screen [facility] 2/9 04/27/2020 05/23/2019 05/20/2018 05/07/2018  Decreased Interest 0 0 0 0  Down, Depressed, Hopeless 0 0 0 0  PHQ - 2 Score 0 0 0 0  Altered sleeping - - 0 0  Tired, decreased energy - - 0 0  Change in appetite - - 0 0  Feeling bad or failure about yourself  - - 0 0  Trouble concentrating - - 0 0  Moving slowly or fidgety/restless - - 0 0  Suicidal thoughts - - 0 0  PHQ-9 Score - - 0 0  Difficult doing work/chores - - - Not difficult at all    Advanced Directives <no information>  Past Medical History:  History reviewed. No pertinent past medical history.  Surgical  History:  History reviewed. No pertinent surgical history.  Medications:  Current Outpatient Medications on File Prior to Visit  Medication Sig  . cyclobenzaprine (FLEXERIL) 10 MG tablet Take 1 tablet (10 mg total) by mouth 3 (three) times daily as needed for muscle spasms.  . fluconazole (DIFLUCAN) 150 MG tablet Take 1 tablet (150 mg total) by mouth daily.  13/07/2017 ketoconazole (NIZORAL) 2 % cream Apply 1 application topically 2 (two) times daily as needed for irritation.  . sildenafil (REVATIO) 20 MG tablet Take 1-5 tablets daily prn  . valACYclovir (VALTREX) 1000 MG tablet TAKE 1 TABLET(1000 MG) BY MOUTH TWICE DAILY (Patient taking differently: as needed. )   No current facility-administered medications on file prior to visit.    Allergies:  Allergies  Allergen Reactions  . Other Rash    Nitrile latex free gloves    Social History:  Social History   Socioeconomic History  . Marital status: Single    Spouse name: Not on file  . Number of children: Not on file  . Years of education: Not on file  . Highest education level: Not on file  Occupational History  . Not on file  Tobacco Use  . Smoking status: Former Smoker    Quit date: 05/07/2010    Years since quitting: 9.9  . Smokeless tobacco: Former  User    Quit date: 05/07/1998  Vaping Use  . Vaping Use: Never used  Substance and Sexual Activity  . Alcohol use: Yes    Alcohol/week: 20.0 standard drinks    Types: 20 Cans of beer per week  . Drug use: Never  . Sexual activity: Yes  Other Topics Concern  . Not on file  Social History Narrative  . Not on file   Social Determinants of Health   Financial Resource Strain:   . Difficulty of Paying Living Expenses: Not on file  Food Insecurity:   . Worried About Programme researcher, broadcasting/film/videounning Out of Food in the Last Year: Not on file  . Ran Out of Food in the Last Year: Not on file  Transportation Needs:   . Lack of Transportation (Medical): Not on file  . Lack of Transportation (Non-Medical):  Not on file  Physical Activity:   . Days of Exercise per Week: Not on file  . Minutes of Exercise per Session: Not on file  Stress:   . Feeling of Stress : Not on file  Social Connections:   . Frequency of Communication with Friends and Family: Not on file  . Frequency of Social Gatherings with Friends and Family: Not on file  . Attends Religious Services: Not on file  . Active Member of Clubs or Organizations: Not on file  . Attends BankerClub or Organization Meetings: Not on file  . Marital Status: Not on file  Intimate Partner Violence:   . Fear of Current or Ex-Partner: Not on file  . Emotionally Abused: Not on file  . Physically Abused: Not on file  . Sexually Abused: Not on file   Social History   Tobacco Use  Smoking Status Former Smoker  . Quit date: 05/07/2010  . Years since quitting: 9.9  Smokeless Tobacco Former NeurosurgeonUser  . Quit date: 05/07/1998   Social History   Substance and Sexual Activity  Alcohol Use Yes  . Alcohol/week: 20.0 standard drinks  . Types: 20 Cans of beer per week    Family History:  Family History  Adopted: Yes    Past medical history, surgical history, medications, allergies, family history and social history reviewed with patient today and changes made to appropriate areas of the chart.   Review of Systems - negative All other ROS negative except what is listed above and in the HPI.      Objective:    BP 132/86 (BP Location: Left Arm, Patient Position: Sitting, Cuff Size: Normal)   Pulse 86   Temp 98.6 F (37 C) (Oral)   Resp 16   Ht 6\' 1"  (1.854 m)   Wt 276 lb (125.2 kg)   SpO2 97%   BMI 36.41 kg/m   Wt Readings from Last 3 Encounters:  04/27/20 276 lb (125.2 kg)  07/14/19 289 lb (131.1 kg)  05/23/19 289 lb 12.8 oz (131.5 kg)    Physical Exam Vitals and nursing note reviewed.  Constitutional:      General: He is awake. He is not in acute distress.    Appearance: He is well-developed and well-groomed. He is not ill-appearing.    HENT:     Head: Normocephalic and atraumatic.     Right Ear: Hearing, tympanic membrane, ear canal and external ear normal. No drainage.     Left Ear: Hearing, tympanic membrane, ear canal and external ear normal. No drainage.     Nose: Nose normal.     Mouth/Throat:     Pharynx: Uvula midline.  Eyes:     General: Lids are normal.        Right eye: No discharge.        Left eye: No discharge.     Extraocular Movements: Extraocular movements intact.     Conjunctiva/sclera: Conjunctivae normal.     Pupils: Pupils are equal, round, and reactive to light.     Visual Fields: Right eye visual fields normal and left eye visual fields normal.  Neck:     Thyroid: No thyromegaly.     Vascular: No carotid bruit or JVD.     Trachea: Trachea normal.  Cardiovascular:     Rate and Rhythm: Normal rate and regular rhythm.     Heart sounds: Normal heart sounds, S1 normal and S2 normal. No murmur heard.  No gallop.   Pulmonary:     Effort: Pulmonary effort is normal. No accessory muscle usage or respiratory distress.     Breath sounds: Normal breath sounds.  Abdominal:     General: Bowel sounds are normal.     Palpations: Abdomen is soft. There is no hepatomegaly or splenomegaly.     Tenderness: There is no abdominal tenderness.  Musculoskeletal:        General: Normal range of motion.     Cervical back: Normal range of motion and neck supple.     Right lower leg: No edema.     Left lower leg: No edema.  Lymphadenopathy:     Head:     Right side of head: No submental, submandibular, tonsillar, preauricular or posterior auricular adenopathy.     Left side of head: No submental, submandibular, tonsillar, preauricular or posterior auricular adenopathy.     Cervical: No cervical adenopathy.  Skin:    General: Skin is warm and dry.     Capillary Refill: Capillary refill takes less than 2 seconds.     Findings: No rash.  Neurological:     Mental Status: He is alert and oriented to person,  place, and time.     Cranial Nerves: Cranial nerves are intact.     Gait: Gait is intact.     Deep Tendon Reflexes: Reflexes are normal and symmetric.     Reflex Scores:      Brachioradialis reflexes are 2+ on the right side and 2+ on the left side.      Patellar reflexes are 2+ on the right side and 2+ on the left side. Psychiatric:        Attention and Perception: Attention normal.        Mood and Affect: Mood normal.        Speech: Speech normal.        Behavior: Behavior normal. Behavior is cooperative.        Thought Content: Thought content normal.        Cognition and Memory: Cognition normal.        Judgment: Judgment normal.    Results for orders placed or performed in visit on 07/26/19  HgB A1c  Result Value Ref Range   Hgb A1c MFr Bld 5.7 (H) 4.8 - 5.6 %   Est. average glucose Bld gHb Est-mCnc 117 mg/dL  Basic metabolic panel  Result Value Ref Range   Glucose 91 65 - 99 mg/dL   BUN 9 6 - 20 mg/dL   Creatinine, Ser 8.58 0.76 - 1.27 mg/dL   GFR calc non Af Amer 111 >59 mL/min/1.73   GFR calc Af Amer 128 >59 mL/min/1.73   BUN/Creatinine Ratio  11 9 - 20   Sodium 139 134 - 144 mmol/L   Potassium 4.4 3.5 - 5.2 mmol/L   Chloride 101 96 - 106 mmol/L   CO2 23 20 - 29 mmol/L   Calcium 9.3 8.7 - 10.2 mg/dL      Assessment & Plan:   Problem List Items Addressed This Visit    None    Visit Diagnoses    Routine general medical examination at a health care facility    -  Primary   Annual labs today to include CBC, CMP, TSH, Lipid   Relevant Orders   CBC with Differential/Platelet   Comprehensive metabolic panel   Need for hepatitis C screening test       Hep C screening on labs today   Relevant Orders   Hepatitis C antibody   Screening cholesterol level       Lipid panel on labs today   Relevant Orders   Lipid Panel w/o Chol/HDL Ratio   Thyroid disorder screening       TSH on labs today   Relevant Orders   TSH      Discussed aspirin prophylaxis for myocardial  infarction prevention and decision was it was not indicated  LABORATORY TESTING:  Health maintenance labs ordered today as discussed above.   IMMUNIZATIONS:   - Tdap: Tetanus vaccination status reviewed: last tetanus booster within 10 years. - Influenza: going to hold off  - Pneumovax: Not applicable - Prevnar: Not applicable - Zostavax vaccine: Not applicable  SCREENING: - Colonoscopy: Not applicable  Discussed with patient purpose of the colonoscopy is to detect colon cancer at curable precancerous or early stages   - AAA Screening: Not applicable  -Hearing Test: Not applicable  -Spirometry: Not applicable   PATIENT COUNSELING:    Sexuality: Discussed sexually transmitted diseases, partner selection, use of condoms, avoidance of unintended pregnancy  and contraceptive alternatives.   Advised to avoid cigarette smoking.  I discussed with the patient that most people either abstain from alcohol or drink within safe limits (<=14/week and <=4 drinks/occasion for males, <=7/weeks and <= 3 drinks/occasion for females) and that the risk for alcohol disorders and other health effects rises proportionally with the number of drinks per week and how often a drinker exceeds daily limits.  Discussed cessation/primary prevention of drug use and availability of treatment for abuse.   Diet: Encouraged to adjust caloric intake to maintain  or achieve ideal body weight, to reduce intake of dietary saturated fat and total fat, to limit sodium intake by avoiding high sodium foods and not adding table salt, and to maintain adequate dietary potassium and calcium preferably from fresh fruits, vegetables, and low-fat dairy products.    Stressed the importance of regular exercise  Injury prevention: Discussed safety belts, safety helmets, smoke detector, smoking near bedding or upholstery.   Dental health: Discussed importance of regular tooth brushing, flossing, and dental visits.   Follow up  plan: NEXT PREVENTATIVE PHYSICAL DUE IN 1 YEAR. Return in about 1 year (around 04/27/2021) for Annual physical.

## 2020-04-27 NOTE — Patient Instructions (Signed)
Healthy Eating Following a healthy eating pattern may help you to achieve and maintain a healthy body weight, reduce the risk of chronic disease, and live a long and productive life. It is important to follow a healthy eating pattern at an appropriate calorie level for your body. Your nutritional needs should be met primarily through food by choosing a variety of nutrient-rich foods. What are tips for following this plan? Reading food labels  Read labels and choose the following: ? Reduced or low sodium. ? Juices with 100% fruit juice. ? Foods with low saturated fats and high polyunsaturated and monounsaturated fats. ? Foods with whole grains, such as whole wheat, cracked wheat, brown rice, and wild rice. ? Whole grains that are fortified with folic acid. This is recommended for women who are pregnant or who want to become pregnant.  Read labels and avoid the following: ? Foods with a lot of added sugars. These include foods that contain brown sugar, corn sweetener, corn syrup, dextrose, fructose, glucose, high-fructose corn syrup, honey, invert sugar, lactose, malt syrup, maltose, molasses, raw sugar, sucrose, trehalose, or turbinado sugar.  Do not eat more than the following amounts of added sugar per day:  6 teaspoons (25 g) for women.  9 teaspoons (38 g) for men. ? Foods that contain processed or refined starches and grains. ? Refined grain products, such as white flour, degermed cornmeal, white bread, and white rice. Shopping  Choose nutrient-rich snacks, such as vegetables, whole fruits, and nuts. Avoid high-calorie and high-sugar snacks, such as potato chips, fruit snacks, and candy.  Use oil-based dressings and spreads on foods instead of solid fats such as butter, stick margarine, or cream cheese.  Limit pre-made sauces, mixes, and "instant" products such as flavored rice, instant noodles, and ready-made pasta.  Try more plant-protein sources, such as tofu, tempeh, black beans,  edamame, lentils, nuts, and seeds.  Explore eating plans such as the Mediterranean diet or vegetarian diet. Cooking  Use oil to saut or stir-fry foods instead of solid fats such as butter, stick margarine, or lard.  Try baking, boiling, grilling, or broiling instead of frying.  Remove the fatty part of meats before cooking.  Steam vegetables in water or broth. Meal planning   At meals, imagine dividing your plate into fourths: ? One-half of your plate is fruits and vegetables. ? One-fourth of your plate is whole grains. ? One-fourth of your plate is protein, especially lean meats, poultry, eggs, tofu, beans, or nuts.  Include low-fat dairy as part of your daily diet. Lifestyle  Choose healthy options in all settings, including home, work, school, restaurants, or stores.  Prepare your food safely: ? Wash your hands after handling raw meats. ? Keep food preparation surfaces clean by regularly washing with hot, soapy water. ? Keep raw meats separate from ready-to-eat foods, such as fruits and vegetables. ? Cook seafood, meat, poultry, and eggs to the recommended internal temperature. ? Store foods at safe temperatures. In general:  Keep cold foods at 59F (4.4C) or below.  Keep hot foods at 159F (60C) or above.  Keep your freezer at [facility] (-17.8C) or below.  Foods are no longer safe to eat when they have been between the temperatures of 40-159F (4.4-60C) for more than 2 hours. What foods should I eat? Fruits Aim to eat 2 cup-equivalents of fresh, canned (in natural juice), or frozen fruits each day. Examples of 1 cup-equivalent of fruit include 1 small apple, 8 large strawberries, 1 cup canned fruit,  cup  dried fruit, or 1 cup 100% juice. Vegetables Aim to eat 2-3 cup-equivalents of fresh and frozen vegetables each day, including different varieties and colors. Examples of 1 cup-equivalent of vegetables include 2 medium carrots, 2 cups raw, leafy greens, 1 cup chopped  vegetable (raw or cooked), or 1 medium baked potato. Grains Aim to eat 6 ounce-equivalents of whole grains each day. Examples of 1 ounce-equivalent of grains include 1 slice of bread, 1 cup ready-to-eat cereal, 3 cups popcorn, or  cup cooked rice, pasta, or cereal. Meats and other proteins Aim to eat 5-6 ounce-equivalents of protein each day. Examples of 1 ounce-equivalent of protein include 1 egg, 1/2 cup nuts or seeds, or 1 tablespoon (16 g) peanut butter. A cut of meat or fish that is the size of a deck of cards is about 3-4 ounce-equivalents.  Of the protein you eat each week, try to have at least 8 ounces come from seafood. This includes salmon, trout, herring, and anchovies. Dairy Aim to eat 3 cup-equivalents of fat-free or low-fat dairy each day. Examples of 1 cup-equivalent of dairy include 1 cup (240 mL) milk, 8 ounces (250 g) yogurt, 1 ounces (44 g) natural cheese, or 1 cup (240 mL) fortified soy milk. Fats and oils  Aim for about 5 teaspoons (21 g) per day. Choose monounsaturated fats, such as canola and olive oils, avocados, peanut butter, and most nuts, or polyunsaturated fats, such as sunflower, corn, and soybean oils, walnuts, pine nuts, sesame seeds, sunflower seeds, and flaxseed. Beverages  Aim for six 8-oz glasses of water per day. Limit coffee to three to five 8-oz cups per day.  Limit caffeinated beverages that have added calories, such as soda and energy drinks.  Limit alcohol intake to no more than 1 drink a day for nonpregnant women and 2 drinks a day for men. One drink equals 12 oz of beer (355 mL), 5 oz of wine (148 mL), or 1 oz of hard liquor (44 mL). Seasoning and other foods  Avoid adding excess amounts of salt to your foods. Try flavoring foods with herbs and spices instead of salt.  Avoid adding sugar to foods.  Try using oil-based dressings, sauces, and spreads instead of solid fats. This information is based on general [place] nutrition guidelines. For more  information, visit [employer]. Exact amounts may vary based on your nutrition needs. Summary  A healthy eating plan may help you to maintain a healthy weight, reduce the risk of chronic diseases, and stay active throughout your life.  Plan your meals. Make sure you eat the right portions of a variety of nutrient-rich foods.  Try baking, boiling, grilling, or broiling instead of frying.  Choose healthy options in all settings, including home, work, school, restaurants, or stores. This information is not intended to replace advice given to you by your health care provider. Make sure you discuss any questions you have with your health care provider. Document Revised: 10/05/2017 Document Reviewed: 10/05/2017 Elsevier Patient Education  [place].

## 2020-04-28 LAB — CBC WITH DIFFERENTIAL/PLATELET
Basophils Absolute: 0 10*3/uL (ref 0.0–0.2)
Basos: 0 %
EOS (ABSOLUTE): 0.1 10*3/uL (ref 0.0–0.4)
Eos: 1 %
Hematocrit: 46.2 % (ref 37.5–51.0)
Hemoglobin: 16.3 g/dL (ref 13.0–17.7)
Immature Grans (Abs): 0 10*3/uL (ref 0.0–0.1)
Immature Granulocytes: 0 %
Lymphocytes Absolute: 1.6 10*3/uL (ref 0.7–3.1)
Lymphs: 28 %
MCH: 31.4 pg (ref 26.6–33.0)
MCHC: 35.3 g/dL (ref 31.5–35.7)
MCV: 89 fL (ref 79–97)
Monocytes Absolute: 0.6 10*3/uL (ref 0.1–0.9)
Monocytes: 11 %
Neutrophils Absolute: 3.3 10*3/uL (ref 1.4–7.0)
Neutrophils: 60 %
Platelets: 236 10*3/uL (ref 150–450)
RBC: 5.19 x10E6/uL (ref 4.14–5.80)
RDW: 13.2 % (ref 11.6–15.4)
WBC: 5.6 10*3/uL (ref 3.4–10.8)

## 2020-04-28 LAB — COMPREHENSIVE METABOLIC PANEL
ALT: 24 IU/L (ref 0–44)
AST: 26 IU/L (ref 0–40)
Albumin/Globulin Ratio: 1.7 (ref 1.2–2.2)
Albumin: 4.8 g/dL (ref 4.0–5.0)
Alkaline Phosphatase: 92 IU/L (ref 44–121)
BUN/Creatinine Ratio: 11 (ref 9–20)
BUN: 11 mg/dL (ref 6–20)
Bilirubin Total: 0.7 mg/dL (ref 0.0–1.2)
CO2: 23 mmol/L (ref 20–29)
Calcium: 9 mg/dL (ref 8.7–10.2)
Chloride: 102 mmol/L (ref 96–106)
Creatinine, Ser: 0.96 mg/dL (ref 0.76–1.27)
GFR calc Af Amer: 115 mL/min/{1.73_m2} (ref >59)
GFR calc non Af Amer: 99 mL/min/{1.73_m2} (ref >59)
Globulin, Total: 2.8 g/dL (ref 1.5–4.5)
Glucose: 81 mg/dL (ref 65–99)
Potassium: 3.8 mmol/L (ref 3.5–5.2)
Sodium: 139 mmol/L (ref 134–144)
Total Protein: 7.6 g/dL (ref 6.0–8.5)

## 2020-04-28 LAB — LIPID PANEL W/O CHOL/HDL RATIO
Cholesterol, Total: 190 mg/dL (ref 100–199)
HDL: 36 mg/dL — ABNORMAL LOW (ref >39)
LDL Chol Calc (NIH): 132 mg/dL — ABNORMAL HIGH (ref 0–99)
Triglycerides: 121 mg/dL (ref 0–149)
VLDL Cholesterol Cal: 22 mg/dL (ref 5–40)

## 2020-04-28 LAB — TSH: TSH: 1.32 u[IU]/mL (ref 0.450–4.500)

## 2020-04-28 LAB — HEPATITIS C ANTIBODY: Hep C Virus Ab: 0.1 s/co ratio (ref 0.0–0.9)

## 2020-04-29 NOTE — Progress Notes (Signed)
Contacted via MyChart Good morning Robert Jensen, your labs have returned and overall look good.  Hep C is negative.  Your LDL is above normal, but recommendation is diet focus. The LDL is the bad cholesterol. Over time and in combination with inflammation and other factors, this contributes to plaque which in turn may lead to stroke and/or heart attack down the road. Sometimes high LDL is primarily genetic, and people might be eating all the right foods but still have high numbers. Other times, there is room for improvement in one's diet and eating healthier can bring this number down and potentially reduce one's risk of heart attack and/or stroke.   To reduce your LDL, Remember - more fruits and vegetables, more fish, and limit red meat and dairy products. More soy, nuts, beans, barley, lentils, oats and plant sterol ester enriched margarine instead of butter. I also encourage eliminating sugar and processed food. Remember, shop on the outside of the grocery store and visit your [employer]. If you would like to talk with me about dietary changes for your cholesterol, please let me know. We should recheck your cholesterol in 12 months.  Any questions? Keep being awesome!!  Thank you for allowing me to participate in your care. Kindest regards, Christophor Eick

## 2020-11-08 ENCOUNTER — Other Ambulatory Visit: Payer: Self-pay | Admitting: Nurse Practitioner

## 2020-11-08 ENCOUNTER — Encounter: Payer: Self-pay | Admitting: Nurse Practitioner

## 2020-11-08 ENCOUNTER — Other Ambulatory Visit: Payer: Self-pay

## 2020-11-08 DIAGNOSIS — N529 Male erectile dysfunction, unspecified: Secondary | ICD-10-CM | POA: Insufficient documentation

## 2020-11-08 MED ORDER — VALACYCLOVIR HCL 1 G PO TABS
ORAL_TABLET | ORAL | 6 refills | Status: DC
Start: 2020-11-08 — End: 2021-06-21

## 2020-11-08 MED ORDER — TADALAFIL 2.5 MG PO TABS
2.5000 mg | ORAL_TABLET | ORAL | 2 refills | Status: DC | PRN
Start: 2020-11-08 — End: 2021-02-06

## 2020-11-08 MED ORDER — TADALAFIL 2.5 MG PO TABS: 2.5000 mg | ORAL_TABLET | ORAL | 2 refills | Status: DC | PRN

## 2021-02-06 MED ORDER — TADALAFIL 5 MG PO TABS: 5.0000 mg | ORAL_TABLET | Freq: Every day | ORAL | 11 refills | Status: DC | PRN

## 2021-06-20 ENCOUNTER — Encounter: Payer: Self-pay | Admitting: Nurse Practitioner

## 2021-06-21 ENCOUNTER — Encounter: Payer: Self-pay | Admitting: Nurse Practitioner

## 2021-06-21 ENCOUNTER — Ambulatory Visit: Payer: BC Managed Care – PPO | Admitting: Nurse Practitioner

## 2021-06-21 ENCOUNTER — Other Ambulatory Visit: Payer: Self-pay

## 2021-06-21 VITALS — BP 128/85 | HR 76 | Temp 98.0°F | Ht 73.5 in | Wt 298.2 lb

## 2021-06-21 DIAGNOSIS — Z0000 Encounter for general adult medical examination without abnormal findings: Secondary | ICD-10-CM | POA: Diagnosis not present

## 2021-06-21 DIAGNOSIS — E7800 Pure hypercholesterolemia, unspecified: Secondary | ICD-10-CM | POA: Diagnosis not present

## 2021-06-21 DIAGNOSIS — E669 Obesity, unspecified: Secondary | ICD-10-CM

## 2021-06-21 DIAGNOSIS — N528 Other male erectile dysfunction: Secondary | ICD-10-CM | POA: Diagnosis not present

## 2021-06-21 NOTE — Patient Instructions (Signed)
Health Maintenance, Male Adopting a healthy lifestyle and getting preventive care are important in promoting health and wellness. Ask your health care provider about: The right schedule for you to have regular tests and exams. Things you can do on your own to prevent diseases and keep yourself healthy. What should I know about diet, weight, and exercise? Eat a healthy diet  Eat a diet that includes plenty of vegetables, fruits, low-fat dairy products, and lean protein. Do not eat a lot of foods that are high in solid fats, added sugars, or sodium. Maintain a healthy weight Body mass index (BMI) is a measurement that can be used to identify possible weight problems. It estimates body fat based on height and weight. Your health care provider can help determine your BMI and help you achieve or maintain a healthy weight. Get regular exercise Get regular exercise. This is one of the most important things you can do for your health. Most adults should: Exercise for at least 150 minutes each week. The exercise should increase your heart rate and make you sweat (moderate-intensity exercise). Do strengthening exercises at least twice a week. This is in addition to the moderate-intensity exercise. Spend less time sitting. Even light physical activity can be beneficial. Watch cholesterol and blood lipids Have your blood tested for lipids and cholesterol at 40 years of age, then have this test every 5 years. You may need to have your cholesterol levels checked more often if: Your lipid or cholesterol levels are high. You are older than 40 years of age. You are at high risk for heart disease. What should I know about cancer screening? Many types of cancers can be detected early and may often be prevented. Depending on your health history and family history, you may need to have cancer screening at various ages. This may include screening for: Colorectal cancer. Prostate cancer. Skin cancer. Lung  cancer. What should I know about heart disease, diabetes, and high blood pressure? Blood pressure and heart disease High blood pressure causes heart disease and increases the risk of stroke. This is more likely to develop in people who have high blood pressure readings or are overweight. Talk with your health care provider about your target blood pressure readings. Have your blood pressure checked: Every 3-5 years if you are 18-39 years of age. Every year if you are 40 years old or older. If you are between the ages of 65 and 75 and are a current or former smoker, ask your health care provider if you should have a one-time screening for abdominal aortic aneurysm (AAA). Diabetes Have regular diabetes screenings. This checks your fasting blood sugar level. Have the screening done: Once every three years after age 45 if you are at a normal weight and have a low risk for diabetes. More often and at a younger age if you are overweight or have a high risk for diabetes. What should I know about preventing infection? Hepatitis B If you have a higher risk for hepatitis B, you should be screened for this virus. Talk with your health care provider to find out if you are at risk for hepatitis B infection. Hepatitis C Blood testing is recommended for: Everyone born from 1945 through 1965. Anyone with known risk factors for hepatitis C. Sexually transmitted infections (STIs) You should be screened each year for STIs, including gonorrhea and chlamydia, if: You are sexually active and are younger than 40 years of age. You are older than 40 years of age and your   health care provider tells you that you are at risk for this type of infection. Your sexual activity has changed since you were last screened, and you are at increased risk for chlamydia or gonorrhea. Ask your health care provider if you are at risk. Ask your health care provider about whether you are at high risk for HIV. Your health care provider  may recommend a prescription medicine to help prevent HIV infection. If you choose to take medicine to prevent HIV, you should first get tested for HIV. You should then be tested every 3 months for as long as you are taking the medicine. Follow these instructions at home: Alcohol use Do not drink alcohol if your health care provider tells you not to drink. If you drink alcohol: Limit how much you have to 0-2 drinks a day. Know how much alcohol is in your drink. In the [place] one drink equals one 12 oz bottle of beer (355 mL), one 5 oz glass of wine (148 mL), or one 1 oz glass of hard liquor (44 mL). Lifestyle Do not use any products that contain nicotine or tobacco. These products include cigarettes, chewing tobacco, and vaping devices, such as e-cigarettes. If you need help quitting, ask your health care provider. Do not use street drugs. Do not share needles. Ask your health care provider for help if you need support or information about quitting drugs. General instructions Schedule regular health, dental, and eye exams. Stay current with your vaccines. Tell your health care provider if: You often feel depressed. You have ever been abused or do not feel safe at home. Summary Adopting a healthy lifestyle and getting preventive care are important in promoting health and wellness. Follow your health care provider's instructions about healthy diet, exercising, and getting tested or screened for diseases. Follow your health care provider's instructions on monitoring your cholesterol and blood pressure. This information is not intended to replace advice given to you by your health care provider. Make sure you discuss any questions you have with your health care provider. Document Revised: 11/12/2020 Document Reviewed: 11/12/2020 Elsevier Patient Education  2022 Elsevier Inc.  American Heart Association (AHA) Exercise Recommendation  Being physically active is important to prevent heart  disease and stroke, the nation's No. 1and No. 5killers. To improve overall cardiovascular health, we suggest at least 150 minutes per week of moderate exercise or 75 minutes per week of vigorous exercise (or a combination of moderate and vigorous activity). Thirty minutes a day, five times a week is an easy goal to remember. You will also experience benefits even if you divide your time into two or three segments of 10 to 15 minutes per day.  For people who would benefit from lowering their blood pressure or cholesterol, we recommend 40 minutes of aerobic exercise of moderate to vigorous intensity three to four times a week to lower the risk for heart attack and stroke.  Physical activity is anything that makes you move your body and burn calories.  This includes things like climbing stairs or playing sports. Aerobic exercises benefit your heart, and include walking, jogging, swimming or biking. Strength and stretching exercises are best for overall stamina and flexibility.  The simplest, positive change you can make to effectively improve your heart health is to start walking. It's enjoyable, free, easy, social and great exercise. A walking program is flexible and boasts high success rates because people can stick with it. It's easy for walking to become a regular and satisfying part of   life.   For Overall Cardiovascular Health: At least 30 minutes of moderate-intensity aerobic activity at least 5 days per week for a total of 150  OR  At least 25 minutes of vigorous aerobic activity at least 3 days per week for a total of 75 minutes; or a combination of moderate- and vigorous-intensity aerobic activity  AND  Moderate- to high-intensity muscle-strengthening activity at least 2 days per week for additional health benefits.  For Lowering Blood Pressure and Cholesterol An average 40 minutes of moderate- to vigorous-intensity aerobic activity 3 or 4 times per week  What if I can't make it to the  time goal? Something is always better than nothing! And everyone has to start somewhere. Even if you've been sedentary for years, today is the day you can begin to make healthy changes in your life. If you don't think you'll make it for 30 or 40 minutes, set a reachable goal for today. You can work up toward your overall goal by increasing your time as you get stronger. Don't let all-or-nothing thinking rob you of doing what you can every day.  Source:http://www.heart.org    

## 2021-06-21 NOTE — Assessment & Plan Note (Signed)
BMI 38.81.  Recommended eating smaller high protein, low fat meals more frequently and exercising 30 mins a day 5 times a week with a goal of 10-15lb weight loss in the next 3 months. Patient voiced their understanding and motivation to adhere to these recommendations.  

## 2021-06-21 NOTE — Assessment & Plan Note (Signed)
Noted on past labs -- ASCVD 1.6%. Continue diet focus and check lipid and CMP today.

## 2021-06-21 NOTE — Assessment & Plan Note (Signed)
Cialis as needed, continue this regimen and refill as needed. 

## 2021-06-21 NOTE — Progress Notes (Signed)
BP 128/85    Pulse 76    Temp 98 F (36.7 C)    Ht 6' 1.5" (1.867 m)    Wt 298 lb 3.2 oz (135.3 kg)    SpO2 97%    BMI 38.81 kg/m    Subjective:    Patient ID: Robert Jensen, male    DOB: 05/07/1981, 40 y.o.   MRN: [id number]  HPI: Robert Jensen is a 40 y.o. male presenting on 06/21/2021 for comprehensive medical examination. Current medical complaints include:none  He currently lives with: significant other Interim Problems from his last visit: no  The 10-year ASCVD risk score (Arnett DK, et al., 2019) is: 1.6%   Values used to calculate the score:     Age: 61 years     Sex: Male     Is Non-Hispanic African American: No     Diabetic: No     Tobacco smoker: No     Systolic Blood Pressure: 0000000 mmHg     Is BP treated: No     HDL Cholesterol: 36 mg/dL     Total Cholesterol: 190 mg/dL   Functional Status Survey: Is the patient deaf or have difficulty hearing?: No Does the patient have difficulty seeing, even when wearing glasses/contacts?: No Does the patient have difficulty concentrating, remembering, or making decisions?: No Does the patient have difficulty walking or climbing stairs?: No Does the patient have difficulty dressing or bathing?: No Does the patient have difficulty doing errands alone such as visiting a doctor's office or shopping?: No  FALL RISK: Fall Risk  06/21/2021 04/27/2020 05/23/2019 05/07/2018  Falls in the past year? 0 0 0 0  Number falls in past yr: 0 0 0 -  Injury with Fall? 0 0 0 -  Risk for fall due to : No Fall Risks No Fall Risks - -  Follow up Falls prevention discussed Falls evaluation completed Falls evaluation completed -    Depression Screen Depression screen [facility] 2/9 06/21/2021 04/27/2020 05/23/2019 05/20/2018 05/07/2018  Decreased Interest 0 0 0 0 0  Down, Depressed, Hopeless 0 0 0 0 0  PHQ - 2 Score 0 0 0 0 0  Altered sleeping 2 - - 0 0  Tired, decreased energy 1 - - 0 0  Change in appetite 0 - - 0 0  Feeling bad or  failure about yourself  0 - - 0 0  Trouble concentrating 0 - - 0 0  Moving slowly or fidgety/restless 0 - - 0 0  Suicidal thoughts 0 - - 0 0  PHQ-9 Score 3 - - 0 0  Difficult doing work/chores Not difficult at all - - - Not difficult at all    Advanced Directives <no information>  Past Medical History:  History reviewed. No pertinent past medical history.  Surgical History:  History reviewed. No pertinent surgical history.  Medications:  Current Outpatient Medications on File Prior to Visit  Medication Sig   tadalafil (CIALIS) 5 MG tablet Take 1 tablet (5 mg total) by mouth daily as needed for erectile dysfunction. Take 30 minutes prior to intercourse.   No current facility-administered medications on file prior to visit.    Allergies:  Allergies  Allergen Reactions   Other Rash    Nitrile latex free gloves    Social History:  Social History   Socioeconomic History   Marital status: Single    Spouse name: Not on file   Number of children: Not on file   Years of education: Not on  file   Highest education level: Not on file  Occupational History   Not on file  Tobacco Use   Smoking status: Former    Types: Cigarettes    Quit date: 05/07/2010    Years since quitting: 11.1   Smokeless tobacco: Former    Quit date: 05/07/1998  Vaping Use   Vaping Use: Never used  Substance and Sexual Activity   Alcohol use: Yes    Alcohol/week: 20.0 standard drinks    Types: 20 Cans of beer per week   Drug use: Never   Sexual activity: Yes  Other Topics Concern   Not on file  Social History Narrative   Not on file   Social Determinants of Health   Financial Resource Strain: Low Risk    Difficulty of Paying Living Expenses: Not hard at all  Food Insecurity: No Food Insecurity   Worried About Charity fundraiser in the Last Year: Never true   [place] in the Last Year: Never true  Transportation Needs: No Transportation Needs   Lack of Transportation (Medical): No    Lack of Transportation (Non-Medical): No  Physical Activity: Inactive   Days of Exercise per Week: 0 days   Minutes of Exercise per Session: 0 min  Stress: No Stress Concern Present   Feeling of Stress : Not at all  Social Connections: Moderately Isolated   Frequency of Communication with Friends and Family: More than three times a week   Frequency of Social Gatherings with Friends and Family: More than three times a week   Attends Religious Services: Never   Marine scientist or Organizations: No   Attends Music therapist: Never   Marital Status: Living with partner  Intimate Partner Violence: Not At Risk   Fear of Current or Ex-Partner: No   Emotionally Abused: No   Physically Abused: No   Sexually Abused: No   Social History   Tobacco Use  Smoking Status Former   Types: Cigarettes   Quit date: 05/07/2010   Years since quitting: 11.1  Smokeless Tobacco Former   Quit date: 05/07/1998   Social History   Substance and Sexual Activity  Alcohol Use Yes   Alcohol/week: 20.0 standard drinks   Types: 20 Cans of beer per week    Family History:  Family History  Adopted: Yes    Past medical history, surgical history, medications, allergies, family history and social history reviewed with patient today and changes made to appropriate areas of the chart.   Review of Systems - negative All other ROS negative except what is listed above and in the HPI.      Objective:    BP 128/85    Pulse 76    Temp 98 F (36.7 C)    Ht 6' 1.5" (1.867 m)    Wt 298 lb 3.2 oz (135.3 kg)    SpO2 97%    BMI 38.81 kg/m   Wt Readings from Last 3 Encounters:  06/21/21 298 lb 3.2 oz (135.3 kg)  04/27/20 276 lb (125.2 kg)  07/14/19 289 lb (131.1 kg)    Physical Exam Vitals and nursing note reviewed.  Constitutional:      General: He is awake. He is not in acute distress.    Appearance: He is well-developed and well-groomed. He is not ill-appearing.  HENT:     Head:  Normocephalic and atraumatic.     Right Ear: Hearing, tympanic membrane, ear canal and external ear normal.  No drainage.     Left Ear: Hearing, tympanic membrane, ear canal and external ear normal. No drainage.     Nose: Nose normal.     Mouth/Throat:     Pharynx: Uvula midline.  Eyes:     General: Lids are normal.        Right eye: No discharge.        Left eye: No discharge.     Extraocular Movements: Extraocular movements intact.     Conjunctiva/sclera: Conjunctivae normal.     Pupils: Pupils are equal, round, and reactive to light.     Visual Fields: Right eye visual fields normal and left eye visual fields normal.  Neck:     Thyroid: No thyromegaly.     Vascular: No carotid bruit or JVD.     Trachea: Trachea normal.  Cardiovascular:     Rate and Rhythm: Normal rate and regular rhythm.     Heart sounds: Normal heart sounds, S1 normal and S2 normal. No murmur heard.   No gallop.  Pulmonary:     Effort: Pulmonary effort is normal. No accessory muscle usage or respiratory distress.     Breath sounds: Normal breath sounds.  Abdominal:     General: Bowel sounds are normal.     Palpations: Abdomen is soft. There is no hepatomegaly or splenomegaly.     Tenderness: There is no abdominal tenderness.  Musculoskeletal:        General: Normal range of motion.     Cervical back: Normal range of motion and neck supple.     Right lower leg: No edema.     Left lower leg: No edema.  Lymphadenopathy:     Head:     Right side of head: No submental, submandibular, tonsillar, preauricular or posterior auricular adenopathy.     Left side of head: No submental, submandibular, tonsillar, preauricular or posterior auricular adenopathy.     Cervical: No cervical adenopathy.  Skin:    General: Skin is warm and dry.     Capillary Refill: Capillary refill takes less than 2 seconds.     Findings: No rash.  Neurological:     Mental Status: He is alert and oriented to person, place, and time.      Gait: Gait is intact.     Deep Tendon Reflexes: Reflexes are normal and symmetric.     Reflex Scores:      Brachioradialis reflexes are 2+ on the right side and 2+ on the left side.      Patellar reflexes are 2+ on the right side and 2+ on the left side. Psychiatric:        Attention and Perception: Attention normal.        Mood and Affect: Mood normal.        Speech: Speech normal.        Behavior: Behavior normal. Behavior is cooperative.        Thought Content: Thought content normal.        Cognition and Memory: Cognition normal.        Judgment: Judgment normal.   Results for orders placed or performed in visit on 04/27/20  CBC with Differential/Platelet  Result Value Ref Range   WBC 5.6 3.4 - 10.8 x10E3/uL   RBC 5.19 4.14 - 5.80 x10E6/uL   Hemoglobin 16.3 13.0 - 17.7 g/dL   Hematocrit 21.346.2 08.637.5 - 51.0 %   MCV 89 79 - 97 fL   MCH 31.4 26.6 - 33.0 pg  MCHC 35.3 31.5 - 35.7 g/dL   RDW 16.1 09.6 - 04.5 %   Platelets 236 150 - 450 x10E3/uL   Neutrophils 60 Not Estab. %   Lymphs 28 Not Estab. %   Monocytes 11 Not Estab. %   Eos 1 Not Estab. %   Basos 0 Not Estab. %   Neutrophils Absolute 3.3 1.4 - 7.0 x10E3/uL   Lymphocytes Absolute 1.6 0.7 - 3.1 x10E3/uL   Monocytes Absolute 0.6 0.1 - 0.9 x10E3/uL   EOS (ABSOLUTE) 0.1 0.0 - 0.4 x10E3/uL   Basophils Absolute 0.0 0.0 - 0.2 x10E3/uL   Immature Granulocytes 0 Not Estab. %   Immature Grans (Abs) 0.0 0.0 - 0.1 x10E3/uL  Comprehensive metabolic panel  Result Value Ref Range   Glucose 81 65 - 99 mg/dL   BUN 11 6 - 20 mg/dL   Creatinine, Ser 4.09 0.76 - 1.27 mg/dL   GFR calc non Af Amer 99 >59 mL/min/1.73   GFR calc Af Amer 115 >59 mL/min/1.73   BUN/Creatinine Ratio 11 9 - 20   Sodium 139 134 - 144 mmol/L   Potassium 3.8 3.5 - 5.2 mmol/L   Chloride 102 96 - 106 mmol/L   CO2 23 20 - 29 mmol/L   Calcium 9.0 8.7 - 10.2 mg/dL   Total Protein 7.6 6.0 - 8.5 g/dL   Albumin 4.8 4.0 - 5.0 g/dL   Globulin, Total 2.8 1.5 - 4.5 g/dL    Albumin/Globulin Ratio 1.7 1.2 - 2.2   Bilirubin Total 0.7 0.0 - 1.2 mg/dL   Alkaline Phosphatase 92 44 - 121 IU/L   AST 26 0 - 40 IU/L   ALT 24 0 - 44 IU/L  Hepatitis C antibody  Result Value Ref Range   Hep C Virus Ab 0.1 0.0 - 0.9 s/co ratio  Lipid Panel w/o Chol/HDL Ratio  Result Value Ref Range   Cholesterol, Total 190 100 - 199 mg/dL   Triglycerides 811 0 - 149 mg/dL   HDL 36 (L) >91 mg/dL   VLDL Cholesterol Cal 22 5 - 40 mg/dL   LDL Chol Calc (NIH) 478 (H) 0 - 99 mg/dL  TSH  Result Value Ref Range   TSH 1.320 0.450 - 4.500 uIU/mL      Assessment & Plan:   Problem List Items Addressed This Visit       Other   Elevated low density lipoprotein (LDL) cholesterol level    Noted on past labs -- ASCVD 1.6%. Continue diet focus and check lipid and CMP today.      Relevant Orders   Comprehensive metabolic panel   Lipid Panel w/o Chol/HDL Ratio   Erectile dysfunction    Cialis as needed, continue this regimen and refill as needed.      Obesity (BMI 35.0-39.9 without comorbidity) - Primary    BMI 38.81.  Recommended eating smaller high protein, low fat meals more frequently and exercising 30 mins a day 5 times a week with a goal of 10-15lb weight loss in the next 3 months. Patient voiced their understanding and motivation to adhere to these recommendations.       Relevant Orders   CBC with Differential/Platelet   TSH   Other Visit Diagnoses     Encounter for annual physical exam       Annual physical today with labs.  Health maintenance reviewed with patient.       Discussed aspirin prophylaxis for myocardial infarction prevention and decision was it was not indicated  LABORATORY  TESTING:  Health maintenance labs ordered today as discussed above.   IMMUNIZATIONS:   - Tdap: Tetanus vaccination status reviewed: last tetanus booster within 10 years. - Influenza:  going to hold off  - Pneumovax: Not applicable - Prevnar: Not applicable - Zostavax vaccine: Not  applicable - COVID -- has had 2  SCREENING: - Colonoscopy: Not applicable  Discussed with patient purpose of the colonoscopy is to detect colon cancer at curable precancerous or early stages   - AAA Screening: Not applicable  -Hearing Test: Not applicable  -Spirometry: Not applicable   PATIENT COUNSELING:    Sexuality: Discussed sexually transmitted diseases, partner selection, use of condoms, avoidance of unintended pregnancy  and contraceptive alternatives.   Advised to avoid cigarette smoking.  I discussed with the patient that most people either abstain from alcohol or drink within safe limits (<=14/week and <=4 drinks/occasion for males, <=7/weeks and <= 3 drinks/occasion for females) and that the risk for alcohol disorders and other health effects rises proportionally with the number of drinks per week and how often a drinker exceeds daily limits.  Discussed cessation/primary prevention of drug use and availability of treatment for abuse.   Diet: Encouraged to adjust caloric intake to maintain  or achieve ideal body weight, to reduce intake of dietary saturated fat and total fat, to limit sodium intake by avoiding high sodium foods and not adding table salt, and to maintain adequate dietary potassium and calcium preferably from fresh fruits, vegetables, and low-fat dairy products.    Stressed the importance of regular exercise  Injury prevention: Discussed safety belts, safety helmets, smoke detector, smoking near bedding or upholstery.   Dental health: Discussed importance of regular tooth brushing, flossing, and dental visits.   Follow up plan: NEXT PREVENTATIVE PHYSICAL DUE IN 1 YEAR. Return in about 1 year (around 06/21/2022) for Annual physical.

## 2021-06-22 ENCOUNTER — Encounter: Payer: Self-pay | Admitting: Nurse Practitioner

## 2021-06-22 LAB — LIPID PANEL W/O CHOL/HDL RATIO
Cholesterol, Total: 233 mg/dL — ABNORMAL HIGH (ref 100–199)
HDL: 38 mg/dL — ABNORMAL LOW (ref >39)
LDL Chol Calc (NIH): 178 mg/dL — ABNORMAL HIGH (ref 0–99)
Triglycerides: 93 mg/dL (ref 0–149)
VLDL Cholesterol Cal: 17 mg/dL (ref 5–40)

## 2021-06-22 LAB — CBC WITH DIFFERENTIAL/PLATELET
Basophils Absolute: 0 10*3/uL (ref 0.0–0.2)
Basos: 0 %
EOS (ABSOLUTE): 0.2 10*3/uL (ref 0.0–0.4)
Eos: 4 %
Hematocrit: 46.1 % (ref 37.5–51.0)
Hemoglobin: 16.1 g/dL (ref 13.0–17.7)
Immature Grans (Abs): 0 10*3/uL (ref 0.0–0.1)
Immature Granulocytes: 0 %
Lymphocytes Absolute: 1.4 10*3/uL (ref 0.7–3.1)
Lymphs: 26 %
MCH: 31.1 pg (ref 26.6–33.0)
MCHC: 34.9 g/dL (ref 31.5–35.7)
MCV: 89 fL (ref 79–97)
Monocytes Absolute: 0.5 10*3/uL (ref 0.1–0.9)
Monocytes: 9 %
Neutrophils Absolute: 3.3 10*3/uL (ref 1.4–7.0)
Neutrophils: 61 %
Platelets: 227 10*3/uL (ref 150–450)
RBC: 5.17 x10E6/uL (ref 4.14–5.80)
RDW: 13.5 % (ref 11.6–15.4)
WBC: 5.5 10*3/uL (ref 3.4–10.8)

## 2021-06-22 LAB — COMPREHENSIVE METABOLIC PANEL
ALT: 49 IU/L — ABNORMAL HIGH (ref 0–44)
AST: 28 IU/L (ref 0–40)
Albumin/Globulin Ratio: 1.6 (ref 1.2–2.2)
Albumin: 4.5 g/dL (ref 4.0–5.0)
Alkaline Phosphatase: 96 IU/L (ref 44–121)
BUN/Creatinine Ratio: 13 (ref 9–20)
BUN: 10 mg/dL (ref 6–24)
Bilirubin Total: 0.5 mg/dL (ref 0.0–1.2)
CO2: 19 mmol/L — ABNORMAL LOW (ref 20–29)
Calcium: 8.9 mg/dL (ref 8.7–10.2)
Chloride: 101 mmol/L (ref 96–106)
Creatinine, Ser: 0.78 mg/dL (ref 0.76–1.27)
Globulin, Total: 2.9 g/dL (ref 1.5–4.5)
Glucose: 100 mg/dL — ABNORMAL HIGH (ref 70–99)
Potassium: 4.2 mmol/L (ref 3.5–5.2)
Sodium: 137 mmol/L (ref 134–144)
Total Protein: 7.4 g/dL (ref 6.0–8.5)
eGFR: 116 mL/min/{1.73_m2} (ref >59)

## 2021-06-22 LAB — TSH: TSH: 2.02 u[IU]/mL (ref 0.450–4.500)

## 2021-06-23 NOTE — Progress Notes (Signed)
Contacted via MyChart °The 10-year ASCVD risk score (Arnett DK, et al., 2019) is: 2.2% °  Values used to calculate the score: °    Age: 40 years °    Sex: Male °    Is Non-Hispanic African American: No °    Diabetic: No °    Tobacco smoker: No °    Systolic Blood Pressure: 128 mmHg °    Is BP treated: No °    HDL Cholesterol: 38 mg/dL °    Total Cholesterol: 233 mg/dL ° ° °Good morning Drayven, your labs have returned: °- CBC shows no anemia or infection. °- CMP shows normal kidney function, creatinine and eGFR.  Liver function, AST and ALT, shows mild elevation in ALT -- ensure minimal Tylenol or alcohol use at home and focus heavily on healthy diet. °- TSH, thyroid is normal. °- Your cholesterol is still high, but continued recommendations to make lifestyle changes. Your LDL is above normal.  The LDL is the bad cholesterol.  Over time and in combination with inflammation and other factors, this contributes to plaque which in turn may lead to stroke and/or heart attack down the road.  Sometimes high LDL is primarily genetic, and people might be eating all the right foods but still have high numbers.  Other times, there is room for improvement in one's diet and eating healthier can bring this number down and potentially reduce one's risk of heart attack and/or stroke.  °  °To reduce your LDL, Remember - more fruits and vegetables, more fish, and limit red meat and dairy products.  More soy, nuts, beans, barley, lentils, oats and plant sterol ester enriched margarine instead of butter.  I also encourage eliminating sugar and processed food.  Remember, shop on the outside of the grocery store and visit your Farmer's Market.   If you would like to talk with me about dietary changes plus or minus medications for your cholesterol, please let me know. We should recheck your cholesterol in 6-12 months. Any questions? °Keep being amazing!!  Thank you for allowing me to participate in your care.  I appreciate  you. °Kindest regards, °Jolene ° °  ° °

## 2021-12-04 ENCOUNTER — Other Ambulatory Visit: Payer: Self-pay | Admitting: Nurse Practitioner

## 2021-12-05 ENCOUNTER — Encounter: Payer: Self-pay | Admitting: Nurse Practitioner

## 2021-12-05 MED ORDER — VALACYCLOVIR HCL 1 G PO TABS: 1000.0000 mg | ORAL_TABLET | Freq: Two times a day (BID) | ORAL | 1 refills | Status: DC | PRN

## 2021-12-05 NOTE — Telephone Encounter (Signed)
refilled today 12/05/21 Requested Prescriptions  Refused Prescriptions Disp Refills  . valACYclovir (VALTREX) 1000 MG tablet [Pharmacy Med Name: VALACYCLOVIR 1GM TABLETS] 20 tablet 6    Sig: TAKE 1 TABLET(1000 MG) BY MOUTH TWICE DAILY     Antimicrobials:  Antiviral Agents - Anti-Herpetic Passed - 12/04/2021 11:08 PM      Passed - Valid encounter within last 12 months    Recent Outpatient Visits          5 months ago Obesity (BMI 35.0-39.9 without comorbidity)   [facility] [place], [place]rie Dandy T, NP   1 year ago Routine general medical examination at a health care facility   [facility] [place], [place] T, NP   2 years ago Acute bilateral low back pain without sciatica   [facility] Aura Dials T, NP   2 years ago Tinea cruris   [facility] Part[place], [place]   2 years ago Annual physical exam   [facility] Ro[place]velt Maser [place], [place]

## 2022-01-11 ENCOUNTER — Other Ambulatory Visit: Payer: Self-pay | Admitting: Nurse Practitioner

## 2022-01-13 ENCOUNTER — Other Ambulatory Visit: Payer: Self-pay

## 2022-01-13 MED ORDER — TADALAFIL 5 MG PO TABS: ORAL_TABLET | ORAL | 2 refills | Status: DC

## 2022-01-13 NOTE — Telephone Encounter (Signed)
Requested Prescriptions  Pending Prescriptions Disp Refills  . tadalafil (CIALIS) 5 MG tablet [Pharmacy Med Name: TADALAFIL 5MG  TABLETS] 30 tablet 2    Sig: TAKE 1 TABLET(5 MG) BY MOUTH DAILY 30 MINUTES BEFORE SEXUAL ACTIVITY AS NEEDED FOR ERECTILE DYSFUNCTION     Urology: Erectile Dysfunction Agents Failed - 01/11/2022  9:59 PM      Failed - ALT in normal range and within 360 days    ALT  Date Value Ref Range Status  06/21/2021 49 (H) 0 - 44 IU/L Final   SGPT (ALT)  Date Value Ref Range Status  05/08/2014 39 U/L Final    Comment:    14-63 NOTE: New Reference Range 01/24/14          Passed - AST in normal range and within 360 days    AST  Date Value Ref Range Status  06/21/2021 28 0 - 40 IU/L Final   SGOT(AST)  Date Value Ref Range Status  05/08/2014 21 15 - 37 Unit/L Final         Passed - Last BP in normal range    BP Readings from Last 1 Encounters:  06/21/21 128/85         Passed - Valid encounter within last 12 months    Recent Outpatient Visits          6 months ago Obesity (BMI 35.0-39.9 without comorbidity)   [facility] [place], [place] T, NP   1 year ago Routine general medical examination at a health care facility   [facility] [place], [place] T, NP   2 years ago Acute bilateral low back pain without sciatica   [facility] T, NP   2 years ago Tinea cruris   [facility], [place]   2 years ago Annual physical exam   [facility] [place], [place]

## 2022-05-04 ENCOUNTER — Encounter: Payer: Self-pay | Admitting: Nurse Practitioner

## 2022-05-20 ENCOUNTER — Ambulatory Visit: Payer: BC Managed Care – PPO | Admitting: Nurse Practitioner

## 2022-05-23 ENCOUNTER — Encounter: Payer: BC Managed Care – PPO | Admitting: Nurse Practitioner

## 2022-06-30 NOTE — Patient Instructions (Signed)

## 2022-07-04 ENCOUNTER — Encounter: Payer: Self-pay | Admitting: Nurse Practitioner

## 2022-07-04 ENCOUNTER — Ambulatory Visit (INDEPENDENT_AMBULATORY_CARE_PROVIDER_SITE_OTHER): Payer: BC Managed Care – PPO | Admitting: Nurse Practitioner

## 2022-07-04 VITALS — BP 129/83 | HR 73 | Temp 98.2°F | Ht 73.5 in | Wt 275.9 lb

## 2022-07-04 DIAGNOSIS — Z0000 Encounter for general adult medical examination without abnormal findings: Secondary | ICD-10-CM

## 2022-07-04 DIAGNOSIS — E7800 Pure hypercholesterolemia, unspecified: Secondary | ICD-10-CM

## 2022-07-04 DIAGNOSIS — Z23 Encounter for immunization: Secondary | ICD-10-CM

## 2022-07-04 DIAGNOSIS — R7309 Other abnormal glucose: Secondary | ICD-10-CM

## 2022-07-04 DIAGNOSIS — E669 Obesity, unspecified: Secondary | ICD-10-CM

## 2022-07-04 DIAGNOSIS — B009 Herpesviral infection, unspecified: Secondary | ICD-10-CM

## 2022-07-04 DIAGNOSIS — N528 Other male erectile dysfunction: Secondary | ICD-10-CM

## 2022-07-04 MED ORDER — TADALAFIL 5 MG PO TABS: ORAL_TABLET | ORAL | 2 refills | Status: DC

## 2022-07-04 NOTE — Assessment & Plan Note (Signed)
Chronic, continue preventative treatment and refill as needed.

## 2022-07-04 NOTE — Assessment & Plan Note (Signed)
Cialis as needed, continue this regimen and refill as needed.

## 2022-07-04 NOTE — Assessment & Plan Note (Signed)
BMI 35.90, has lost 20 pounds over past year.  Praised for this.  Recommended eating smaller high protein, low fat meals more frequently and exercising 30 mins a day 5 times a week with a goal of 10-15lb weight loss in the next 3 months. Patient voiced their understanding and motivation to adhere to these recommendations.

## 2022-07-04 NOTE — Progress Notes (Signed)
BP 129/83   Pulse 73   Temp 98.2 F (36.8 C) (Oral)   Ht 6' 1.5" (1.867 m)   Wt 275 lb 14.4 oz (125.1 kg)   SpO2 96%   BMI 35.90 kg/m    Subjective:    Patient ID: Robert Jensen, male    DOB: [birth year], 41 y.o.   MRN: [id number]  HPI: Robert Jensen is a 41 y.o. male presenting on 07/04/2022 for comprehensive medical examination. Current medical complaints include:none  He currently lives with: significant other Interim Problems from his last visit: no  Has lost 20 pounds since last visit one year ago, has been working on this with his spouse.    The 10-year ASCVD risk score (Arnett DK, et al., 2019) is: 2.5%   Values used to calculate the score:     Age: 70 years     Sex: Male     Is Non-Hispanic African American: No     Diabetic: No     Tobacco smoker: No     Systolic Blood Pressure: 295 mmHg     Is BP treated: No     HDL Cholesterol: 38 mg/dL     Total Cholesterol: 233 mg/dL   Functional Status Survey: Is the patient deaf or have difficulty hearing?: No Does the patient have difficulty seeing, even when wearing glasses/contacts?: No Does the patient have difficulty concentrating, remembering, or making decisions?: No Does the patient have difficulty walking or climbing stairs?: No Does the patient have difficulty dressing or bathing?: No Does the patient have difficulty doing errands alone such as visiting a doctor's office or shopping?: No  FALL RISK:    07/04/2022    1:39 PM 07/04/2022    1:19 PM 06/21/2021   11:12 AM 04/27/2020    1:57 PM 05/23/2019    2:07 PM  [place] in the past year? 0 1 0 0 0  Number falls in past yr: 0 1 0 0 0  Injury with Fall? 0 1 0 0 0  Risk for fall due to : No Fall Risks No Fall Risks No Fall Risks No Fall Risks   Follow up Falls prevention discussed Falls evaluation completed Falls prevention discussed Falls evaluation completed Falls evaluation completed    Depression Screen    07/04/2022    1:19  PM 06/21/2021   11:05 AM 04/27/2020    1:59 PM 05/23/2019    2:07 PM 05/20/2018    1:25 PM  Depression screen PHQ 2/9  Decreased Interest 0 0 0 0 0  Down, Depressed, Hopeless 0 0 0 0 0  PHQ - 2 Score 0 0 0 0 0  Altered sleeping 1 2   0  Tired, decreased energy 0 1   0  Change in appetite 0 0   0  Feeling bad or failure about yourself  0 0   0  Trouble concentrating 0 0   0  Moving slowly or fidgety/restless 0 0   0  Suicidal thoughts 0 0   0  PHQ-9 Score 1 3   0  Difficult doing work/chores Not difficult at all Not difficult at all      Past Medical History:  History reviewed. No pertinent past medical history.  Surgical History:  History reviewed. No pertinent surgical history.  Medications:  Current Outpatient Medications on File Prior to Visit  Medication Sig   valACYclovir (VALTREX) 1000 MG tablet Take 1 tablet (1,000 mg total) by mouth 2 (two) times  daily as needed (for cold sores).   No current facility-administered medications on file prior to visit.    Allergies:  Allergies  Allergen Reactions   Other Rash    Nitrile latex free gloves    Social History:  Social History   Socioeconomic History   Marital status: Single    Spouse name: Not on file   Number of children: Not on file   Years of education: Not on file   Highest education level: Not on file  Occupational History   Not on file  Tobacco Use   Smoking status: Former    Types: Cigarettes    Quit date: 05/07/2010    Years since quitting: 12.1   Smokeless tobacco: Former    Quit date: 05/07/1998  Vaping Use   Vaping Use: Never used  Substance and Sexual Activity   Alcohol use: Yes    Alcohol/week: 20.0 standard drinks of alcohol    Types: 20 Cans of beer per week   Drug use: Never   Sexual activity: Yes  Other Topics Concern   Not on file  Social History Narrative   Not on file   Social Determinants of Health   Financial Resource Strain: Low Risk  (06/21/2021)   Overall Financial  Resource Strain (CARDIA)    Difficulty of Paying Living Expenses: Not hard at all  Food Insecurity: No Food Insecurity (06/21/2021)   Hunger Vital Sign    Worried About Running Out of Food in the Last Year: Never true    Ran Out of Food in the Last Year: Never true  Transportation Needs: No Transportation Needs (06/21/2021)   PRAPARE - Hydrologist (Medical): No    Lack of Transportation (Non-Medical): No  Physical Activity: Inactive (06/21/2021)   Exercise Vital Sign    Days of Exercise per Week: 0 days    Minutes of Exercise per Session: 0 min  Stress: No Stress Concern Present (06/21/2021)   [place]    Feeling of Stress : Not at all  Social Connections: Moderately Isolated (06/21/2021)   Social Connection and Isolation Panel [NHANES]    Frequency of Communication with Friends and Family: More than three times a week    Frequency of Social Gatherings with Friends and Family: More than three times a week    Attends Religious Services: Never    Marine scientist or Organizations: No    Attends Archivist Meetings: Never    Marital Status: Living with partner  Intimate Partner Violence: Not At Risk (06/21/2021)   Humiliation, Afraid, Rape, and Kick questionnaire    Fear of Current or Ex-Partner: No    Emotionally Abused: No    Physically Abused: No    Sexually Abused: No   Social History   Tobacco Use  Smoking Status Former   Types: Cigarettes   Quit date: 05/07/2010   Years since quitting: 12.1  Smokeless Tobacco Former   Quit date: 05/07/1998   Social History   Substance and Sexual Activity  Alcohol Use Yes   Alcohol/week: 20.0 standard drinks of alcohol   Types: 20 Cans of beer per week    Family History:  Family History  Adopted: Yes    Past medical history, surgical history, medications, allergies, family history and social history reviewed with  patient today and changes made to appropriate areas of the chart.   Review of Systems - negative All other ROS negative  except what is listed above and in the HPI.      Objective:    BP 129/83   Pulse 73   Temp 98.2 F (36.8 C) (Oral)   Ht 6' 1.5" (1.867 m)   Wt 275 lb 14.4 oz (125.1 kg)   SpO2 96%   BMI 35.90 kg/m   Wt Readings from Last 3 Encounters:  07/04/22 275 lb 14.4 oz (125.1 kg)  06/21/21 298 lb 3.2 oz (135.3 kg)  04/27/20 276 lb (125.2 kg)    Physical Exam Vitals and nursing note reviewed.  Constitutional:      General: He is awake. He is not in acute distress.    Appearance: He is well-developed and well-groomed. He is obese. He is not ill-appearing or toxic-appearing.  HENT:     Head: Normocephalic and atraumatic.     Right Ear: Hearing, tympanic membrane, ear canal and external ear normal. No drainage.     Left Ear: Hearing, tympanic membrane, ear canal and external ear normal. No drainage.     Nose: Nose normal.     Mouth/Throat:     Pharynx: Uvula midline.  Eyes:     General: Lids are normal.        Right eye: No discharge.        Left eye: No discharge.     Extraocular Movements: Extraocular movements intact.     Conjunctiva/sclera: Conjunctivae normal.     Pupils: Pupils are equal, round, and reactive to light.     Visual Fields: Right eye visual fields normal and left eye visual fields normal.  Neck:     Thyroid: No thyromegaly.     Vascular: No carotid bruit or JVD.     Trachea: Trachea normal.  Cardiovascular:     Rate and Rhythm: Normal rate and regular rhythm.     Heart sounds: Normal heart sounds, S1 normal and S2 normal. No murmur heard.    No gallop.  Pulmonary:     Effort: Pulmonary effort is normal. No accessory muscle usage or respiratory distress.     Breath sounds: Normal breath sounds.  Abdominal:     General: Bowel sounds are normal.     Palpations: Abdomen is soft. There is no hepatomegaly or splenomegaly.     Tenderness: There  is no abdominal tenderness.  Musculoskeletal:        General: Normal range of motion.     Cervical back: Normal range of motion and neck supple.     Right lower leg: No edema.     Left lower leg: No edema.  Lymphadenopathy:     Head:     Right side of head: No submental, submandibular, tonsillar, preauricular or posterior auricular adenopathy.     Left side of head: No submental, submandibular, tonsillar, preauricular or posterior auricular adenopathy.     Cervical: No cervical adenopathy.  Skin:    General: Skin is warm and dry.     Capillary Refill: Capillary refill takes less than 2 seconds.     Findings: No rash.  Neurological:     Mental Status: He is alert and oriented to person, place, and time.     Gait: Gait is intact.     Deep Tendon Reflexes: Reflexes are normal and symmetric.     Reflex Scores:      Brachioradialis reflexes are 2+ on the right side and 2+ on the left side.      Patellar reflexes are 2+ on the right side and  2+ on the left side. Psychiatric:        Attention and Perception: Attention normal.        Mood and Affect: Mood normal.        Speech: Speech normal.        Behavior: Behavior normal. Behavior is cooperative.        Thought Content: Thought content normal.        Cognition and Memory: Cognition normal.        Judgment: Judgment normal.    Results for orders placed or performed in visit on 06/21/21  CBC with Differential/Platelet  Result Value Ref Range   WBC 5.5 3.4 - 10.8 x10E3/uL   RBC 5.17 4.14 - 5.80 x10E6/uL   Hemoglobin 16.1 13.0 - 17.7 g/dL   Hematocrit 46.1 37.5 - 51.0 %   MCV 89 79 - 97 fL   MCH 31.1 26.6 - 33.0 pg   MCHC 34.9 31.5 - 35.7 g/dL   RDW 13.5 11.6 - 15.4 %   Platelets 227 150 - 450 x10E3/uL   Neutrophils 61 Not Estab. %   Lymphs 26 Not Estab. %   Monocytes 9 Not Estab. %   Eos 4 Not Estab. %   Basos 0 Not Estab. %   Neutrophils Absolute 3.3 1.4 - 7.0 x10E3/uL   Lymphocytes Absolute 1.4 0.7 - 3.1 x10E3/uL    Monocytes Absolute 0.5 0.1 - 0.9 x10E3/uL   EOS (ABSOLUTE) 0.2 0.0 - 0.4 x10E3/uL   Basophils Absolute 0.0 0.0 - 0.2 x10E3/uL   Immature Granulocytes 0 Not Estab. %   Immature Grans (Abs) 0.0 0.0 - 0.1 x10E3/uL  Comprehensive metabolic panel  Result Value Ref Range   Glucose 100 (H) 70 - 99 mg/dL   BUN 10 6 - 24 mg/dL   Creatinine, Ser 0.78 0.76 - 1.27 mg/dL   eGFR 116 >59 mL/min/1.73   BUN/Creatinine Ratio 13 9 - 20   Sodium 137 134 - 144 mmol/L   Potassium 4.2 3.5 - 5.2 mmol/L   Chloride 101 96 - 106 mmol/L   CO2 19 (L) 20 - 29 mmol/L   Calcium 8.9 8.7 - 10.2 mg/dL   Total Protein 7.4 6.0 - 8.5 g/dL   Albumin 4.5 4.0 - 5.0 g/dL   Globulin, Total 2.9 1.5 - 4.5 g/dL   Albumin/Globulin Ratio 1.6 1.2 - 2.2   Bilirubin Total 0.5 0.0 - 1.2 mg/dL   Alkaline Phosphatase 96 44 - 121 IU/L   AST 28 0 - 40 IU/L   ALT 49 (H) 0 - 44 IU/L  Lipid Panel w/o Chol/HDL Ratio  Result Value Ref Range   Cholesterol, Total 233 (H) 100 - 199 mg/dL   Triglycerides 93 0 - 149 mg/dL   HDL 38 (L) >39 mg/dL   VLDL Cholesterol Cal 17 5 - 40 mg/dL   LDL Chol Calc (NIH) 178 (H) 0 - 99 mg/dL  TSH  Result Value Ref Range   TSH 2.020 0.450 - 4.500 uIU/mL      Assessment & Plan:   Problem List Items Addressed This Visit       Other   Elevated low density lipoprotein (LDL) cholesterol level - Primary    Noted on past labs -- ASCVD 2.5% and LDL <190. Continue diet focus and check lipid and CMP today.      Relevant Orders   Comprehensive metabolic panel   Lipid Panel w/o Chol/HDL Ratio   Erectile dysfunction    Cialis as needed, continue this regimen  and refill as needed.      Relevant Orders   CBC with Differential/Platelet   TSH   HSV infection    Chronic, continue preventative treatment and refill as needed.      Obesity (BMI 35.0-39.9 without comorbidity)    BMI 35.90, has lost 20 pounds over past year.  Praised for this.  Recommended eating smaller high protein, low fat meals more  frequently and exercising 30 mins a day 5 times a week with a goal of 10-15lb weight loss in the next 3 months. Patient voiced their understanding and motivation to adhere to these recommendations.       Other Visit Diagnoses     Elevated hemoglobin A1c measurement       History of elevation on past labs, recheck today.   Relevant Orders   Comprehensive metabolic panel   HgB F8B   Encounter for annual physical exam       Annual physical today with labs and health maintenance reviewed, discussed with patient.   Relevant Orders   CBC with Differential/Platelet   TSH       Discussed aspirin prophylaxis for myocardial infarction prevention and decision was it was not indicated  LABORATORY TESTING:  Health maintenance labs ordered today as discussed above.   IMMUNIZATIONS:   - Tdap: Tetanus vaccination status reviewed: last tetanus booster within 10 years. - Influenza: is getting over illness, wishes to wait - Pneumovax: Not applicable - Prevnar: Not applicable - Zostavax vaccine: Not applicable - COVID -- has had 2  SCREENING: - Colonoscopy: Not applicable  Discussed with patient purpose of the colonoscopy is to detect colon cancer at curable precancerous or early stages   - AAA Screening: Not applicable  -Hearing Test: Not applicable  -Spirometry: Not applicable   PATIENT COUNSELING:    Sexuality: Discussed sexually transmitted diseases, partner selection, use of condoms, avoidance of unintended pregnancy  and contraceptive alternatives.   Advised to avoid cigarette smoking.  I discussed with the patient that most people either abstain from alcohol or drink within safe limits (<=14/week and <=4 drinks/occasion for males, <=7/weeks and <= 3 drinks/occasion for females) and that the risk for alcohol disorders and other health effects rises proportionally with the number of drinks per week and how often a drinker exceeds daily limits.  Discussed cessation/primary prevention  of drug use and availability of treatment for abuse.   Diet: Encouraged to adjust caloric intake to maintain  or achieve ideal body weight, to reduce intake of dietary saturated fat and total fat, to limit sodium intake by avoiding high sodium foods and not adding table salt, and to maintain adequate dietary potassium and calcium preferably from fresh fruits, vegetables, and low-fat dairy products.    Stressed the importance of regular exercise  Injury prevention: Discussed safety belts, safety helmets, smoke detector, smoking near bedding or upholstery.   Dental health: Discussed importance of regular tooth brushing, flossing, and dental visits.   Follow up plan: NEXT PREVENTATIVE PHYSICAL DUE IN 1 YEAR. Return in about 1 year (around 07/05/2023) for Annual physical.

## 2022-07-04 NOTE — Assessment & Plan Note (Signed)
Noted on past labs -- ASCVD 2.5% and LDL <190. Continue diet focus and check lipid and CMP today.

## 2022-07-05 ENCOUNTER — Encounter: Payer: Self-pay | Admitting: Nurse Practitioner

## 2022-07-05 LAB — CBC WITH DIFFERENTIAL/PLATELET
Basophils Absolute: 0 10*3/uL (ref 0.0–0.2)
Basos: 1 %
EOS (ABSOLUTE): 0.1 10*3/uL (ref 0.0–0.4)
Eos: 3 %
Hematocrit: 50.1 % (ref 37.5–51.0)
Hemoglobin: 16.5 g/dL (ref 13.0–17.7)
Immature Grans (Abs): 0 10*3/uL (ref 0.0–0.1)
Immature Granulocytes: 0 %
Lymphocytes Absolute: 1.6 10*3/uL (ref 0.7–3.1)
Lymphs: 37 %
MCH: 29.8 pg (ref 26.6–33.0)
MCHC: 32.9 g/dL (ref 31.5–35.7)
MCV: 90 fL (ref 79–97)
Monocytes Absolute: 0.4 10*3/uL (ref 0.1–0.9)
Monocytes: 9 %
Neutrophils Absolute: 2.2 10*3/uL (ref 1.4–7.0)
Neutrophils: 50 %
Platelets: 218 10*3/uL (ref 150–450)
RBC: 5.54 x10E6/uL (ref 4.14–5.80)
RDW: 13.2 % (ref 11.6–15.4)
WBC: 4.4 10*3/uL (ref 3.4–10.8)

## 2022-07-05 LAB — COMPREHENSIVE METABOLIC PANEL
ALT: 46 IU/L — ABNORMAL HIGH (ref 0–44)
AST: 38 IU/L (ref 0–40)
Albumin/Globulin Ratio: 1.5 (ref 1.2–2.2)
Albumin: 4.3 g/dL (ref 4.1–5.1)
Alkaline Phosphatase: 82 IU/L (ref 44–121)
BUN/Creatinine Ratio: 13 (ref 9–20)
BUN: 10 mg/dL (ref 6–24)
Bilirubin Total: 0.8 mg/dL (ref 0.0–1.2)
CO2: 21 mmol/L (ref 20–29)
Calcium: 8.7 mg/dL (ref 8.7–10.2)
Chloride: 103 mmol/L (ref 96–106)
Creatinine, Ser: 0.76 mg/dL (ref 0.76–1.27)
Globulin, Total: 2.9 g/dL (ref 1.5–4.5)
Glucose: 95 mg/dL (ref 70–99)
Potassium: 4.1 mmol/L (ref 3.5–5.2)
Sodium: 137 mmol/L (ref 134–144)
Total Protein: 7.2 g/dL (ref 6.0–8.5)
eGFR: 116 mL/min/{1.73_m2} (ref >59)

## 2022-07-05 LAB — HEMOGLOBIN A1C
Est. average glucose Bld gHb Est-mCnc: 137 mg/dL
Hgb A1c MFr Bld: 6.4 % — ABNORMAL HIGH (ref 4.8–5.6)

## 2022-07-05 LAB — LIPID PANEL W/O CHOL/HDL RATIO
Cholesterol, Total: 207 mg/dL — ABNORMAL HIGH (ref 100–199)
HDL: 29 mg/dL — ABNORMAL LOW (ref >39)
LDL Chol Calc (NIH): 149 mg/dL — ABNORMAL HIGH (ref 0–99)
Triglycerides: 157 mg/dL — ABNORMAL HIGH (ref 0–149)
VLDL Cholesterol Cal: 29 mg/dL (ref 5–40)

## 2022-07-05 LAB — TSH: TSH: 1.38 u[IU]/mL (ref 0.450–4.500)

## 2022-07-05 NOTE — Progress Notes (Signed)
Contacted via MyChart The 10-year ASCVD risk score (Arnett DK, et al., 2019) is: 2.9%   Values used to calculate the score:     Age: 41 years     Sex: Male     Is Non-Hispanic African American: No     Diabetic: No     Tobacco smoker: No     Systolic Blood Pressure: 175 mmHg     Is BP treated: No     HDL Cholesterol: 29 mg/dL     Total Cholesterol: 207 mg/dL    Good evening Robert Jensen, your labs have returned: - Kidney function, creatinine and eGFR, remains normal.  Liver function, AST and ALT, shows mild elevation still in ALT which we will continue to monitor. - A1c has actually trended up a little at 6.4%, last check it was 5.7%.  This is still in prediabetes range, but if trends to 6.5% or greater then this is considered diabetes.  We need to watch closely.  Continue focus on diet and exercise. - CBC shows no anemia or infection.  Thyroid, TSH, is normal. - Your cholesterol is still high (although has come down a little with weight loss), but continued recommendations to make lifestyle changes. Your LDL is above normal. The LDL is the bad cholesterol. Over time and in combination with inflammation and other factors, this contributes to plaque which in turn may lead to stroke and/or heart attack down the road. Sometimes high LDL is primarily genetic, and people might be eating all the right foods but still have high numbers. Other times, there is room for improvement in one's diet and eating healthier can bring this number down and potentially reduce one's risk of heart attack and/or stroke.   To reduce your LDL, Remember - more fruits and vegetables, more fish, and limit red meat and dairy products. More soy, nuts, beans, barley, lentils, oats and plant sterol ester enriched margarine instead of butter. I also encourage eliminating sugar and processed food. Remember, shop on the outside of the grocery store and visit your [employer]. If you would like to talk with me about  dietary changes your cholesterol, please let me know. We should recheck your cholesterol in 12 months.  Any questions? Keep being awesome!!  Thank you for allowing me to participate in your care.  I appreciate you. Kindest regards, Robert Jensen

## 2022-07-08 MED ORDER — TADALAFIL 5 MG PO TABS: ORAL_TABLET | ORAL | 12 refills | Status: DC

## 2023-01-06 ENCOUNTER — Ambulatory Visit: Payer: BC Managed Care – PPO | Admitting: Podiatry

## 2023-01-06 ENCOUNTER — Ambulatory Visit (INDEPENDENT_AMBULATORY_CARE_PROVIDER_SITE_OTHER): Payer: BC Managed Care – PPO

## 2023-01-06 DIAGNOSIS — B351 Tinea unguium: Secondary | ICD-10-CM

## 2023-01-06 DIAGNOSIS — M79671 Pain in right foot: Secondary | ICD-10-CM | POA: Diagnosis not present

## 2023-01-06 DIAGNOSIS — M79672 Pain in left foot: Secondary | ICD-10-CM

## 2023-01-06 DIAGNOSIS — B353 Tinea pedis: Secondary | ICD-10-CM | POA: Diagnosis not present

## 2023-01-06 DIAGNOSIS — M722 Plantar fascial fibromatosis: Secondary | ICD-10-CM

## 2023-01-06 MED ORDER — METHYLPREDNISOLONE 4 MG PO TBPK: ORAL_TABLET | ORAL | 0 refills | Status: DC

## 2023-01-06 MED ORDER — MELOXICAM 15 MG PO TABS: 15.0000 mg | ORAL_TABLET | Freq: Every day | ORAL | 1 refills | Status: DC

## 2023-01-06 MED ORDER — TERBINAFINE HCL 250 MG PO TABS: 250.0000 mg | ORAL_TABLET | Freq: Every day | ORAL | 0 refills | Status: DC

## 2023-01-06 MED ORDER — BETAMETHASONE SOD PHOS & ACET 6 (3-3) MG/ML IJ SUSP
3.0000 mg | Freq: Once | INTRAMUSCULAR | Status: AC
Start: 2023-01-06 — End: 2023-01-06
  Administered 2023-01-06: 3 mg via INTRA_ARTICULAR

## 2023-01-06 MED ORDER — CLOTRIMAZOLE-BETAMETHASONE 1-0.05 % EX CREA: 1.0000 | TOPICAL_CREAM | Freq: Every day | CUTANEOUS | 3 refills | Status: DC

## 2023-01-06 NOTE — Progress Notes (Signed)
Chief Complaint  Patient presents with   Foot Pain    Patient came in today for bilateral heel pain, started a year ago, rate of pain 7 out of 10,  X-Rays done today    Nail Problem    Nail fungus, patient has tried Lamisil before,     Subjective: 42 y.o. male presenting today as a reestablish new patient for evaluation of 2 separate complaints.  Patient states that over the past year he has developed heel pain bilaterally.  Left greater than the right.  He says that he moved into a new house and they do not wear shoes in the house.  The house is hardwood floors.  He gradually developed heel pain.  Now he wears a pair of crocs which seem to help  Patient also states that he has a long history of fungal nail infection with peeling skin.  In the past he did take oral Lamisil which helped tremendously.  It almost resolved his fungal nail infection but he says slowly came back.  Presenting for further treatment evaluation   No past medical history on file.   Bilateral toenails 01/06/2023  Objective: Physical Exam General: The patient is alert and oriented x3 in no acute distress.  Dermatology: Diffuse hyperkeratosis of skin noted to the weightbearing surfaces of the feet consistent with chronic tinea pedis.  Hyperkeratotic dystrophic nails also noted 1-5 bilateral  Vascular: Dorsalis Pedis and Posterior Tibial pulses palpable bilateral.  Capillary fill time is immediate to all digits.  Neurological: Grossly intact via light touch  Musculoskeletal: Tenderness to palpation to the plantar aspect of the bilateral heels along the plantar fascia. All other joints range of motion within normal limits bilateral. Strength 5/5 in all groups bilateral.   Radiographic exam B/L feet 01/06/2023: Normal osseous mineralization. Joint spaces preserved. No fracture/dislocation/boney destruction. No other soft tissue abnormalities or radiopaque foreign bodies.  Small plantar heel spurs noted  bilateral  Assessment: 1. plantar fasciitis bilateral feet. LT > RT -Patient evaluated. Xrays reviewed.   -Injection of 0.5cc Celestone soluspan injected left plantar fascia -Rx for Medrol Dose Pak placed - Rx Meloxicam 15 mg ordered to begin taking after completion of the Dosepak -Plantar fascia brace dispensed.  Wear daily -Continue wearing work boots with Dr. Margart Sickles insoles which seem to help -Return to clinic 4 weeks  2.  Chronic tinea pedis bilateral 3.  Chronic fungal nail infection bilateral -Today we discussed different treatment options regarding onychomycosis and fungal nail infection including oral, topical, and laser antifungal treatment modalities.  Relative efficacies and risk benefits and advantages associated each modality were explained in detail to the patient. -Patient opts for oral antifungal treatment which should also help treat the chronic tinea pedis.  He denies a history of liver pathology or symptoms. -Prescription for Lamisil 250 mg #90 daily.  After completion of the 90-day supply he can request a refill but we will need to get an updated hepatic function panel -Prescription for Lotrisone cream apply twice daily -Return to clinic 6 months  *Please officer for city of [place], DPM [facility]  Dr. Felecia Shelling, DPM    [address], [place] [address]  Office [phone]  Fax [phone]

## 2023-02-01 ENCOUNTER — Encounter: Payer: Self-pay | Admitting: Podiatry

## 2023-02-10 ENCOUNTER — Ambulatory Visit: Payer: BC Managed Care – PPO | Admitting: Podiatry

## 2023-02-26 ENCOUNTER — Other Ambulatory Visit: Payer: Self-pay | Admitting: Podiatry

## 2023-02-26 DIAGNOSIS — M722 Plantar fascial fibromatosis: Secondary | ICD-10-CM

## 2023-02-26 DIAGNOSIS — B351 Tinea unguium: Secondary | ICD-10-CM

## 2023-02-26 DIAGNOSIS — B353 Tinea pedis: Secondary | ICD-10-CM

## 2023-02-26 DIAGNOSIS — M79672 Pain in left foot: Secondary | ICD-10-CM

## 2023-03-05 ENCOUNTER — Other Ambulatory Visit: Payer: Self-pay | Admitting: Podiatry

## 2023-04-02 ENCOUNTER — Encounter: Payer: Self-pay | Admitting: Podiatry

## 2023-04-02 ENCOUNTER — Other Ambulatory Visit: Payer: Self-pay | Admitting: Podiatry

## 2023-04-05 ENCOUNTER — Other Ambulatory Visit: Payer: Self-pay | Admitting: Podiatry

## 2023-04-05 DIAGNOSIS — Z79899 Other long term (current) drug therapy: Secondary | ICD-10-CM

## 2023-04-07 ENCOUNTER — Other Ambulatory Visit: Payer: Self-pay | Admitting: Podiatry

## 2023-04-08 ENCOUNTER — Other Ambulatory Visit: Payer: Self-pay | Admitting: Podiatry

## 2023-04-08 LAB — HEPATIC FUNCTION PANEL
ALT: 33 [IU]/L (ref 0–44)
AST: 21 [IU]/L (ref 0–40)
Albumin: 4.4 g/dL (ref 4.1–5.1)
Alkaline Phosphatase: 109 [IU]/L (ref 44–121)
Bilirubin Total: 0.3 mg/dL (ref 0.0–1.2)
Bilirubin, Direct: 0.1 mg/dL (ref 0.00–0.40)
Total Protein: 7.1 g/dL (ref 6.0–8.5)

## 2023-04-08 MED ORDER — TERBINAFINE HCL 250 MG PO TABS: 250.0000 mg | ORAL_TABLET | Freq: Every day | ORAL | 0 refills | Status: DC

## 2023-05-04 ENCOUNTER — Other Ambulatory Visit: Payer: Self-pay | Admitting: Podiatry

## 2023-07-02 ENCOUNTER — Other Ambulatory Visit: Payer: Self-pay | Admitting: Podiatry

## 2023-07-05 ENCOUNTER — Other Ambulatory Visit: Payer: Self-pay | Admitting: Podiatry

## 2023-08-01 ENCOUNTER — Other Ambulatory Visit: Payer: Self-pay | Admitting: Nurse Practitioner

## 2023-08-03 NOTE — Telephone Encounter (Signed)
Requested medication (s) are due for refill today: Yes  Requested medication (s) are on the active medication list: Yes  Last refill:  07/08/22  Future visit scheduled: No  Notes to clinic:  Left message to call and make appointment.    Requested Prescriptions  Pending Prescriptions Disp Refills   tadalafil (CIALIS) 5 MG tablet [Pharmacy Med Name: TADALAFIL 5MG  TABLETS] 30 tablet 12    Sig: TAKE 1 TABLET(5 MG) BY MOUTH DAILY 30 MINUTES BEFORE SEXUAL ACTIVITY AS NEEDED FOR ERECTILE DYSFUNCTION     Urology: Erectile Dysfunction Agents Failed - 08/03/2023  3:32 PM      Failed - Valid encounter within last 12 months    Recent Outpatient Visits           1 year ago Encounter for annual physical exam   [facility] [place], Corrie Dandy T, NP   2 years ago Obesity (BMI 35.0-39.9 without comorbidity)   [facility] [place], Corrie Dandy T, NP   3 years ago Routine general medical examination at a health care facility   [facility] [place], Jackson T, NP   4 years ago Acute bilateral low back pain without sciatica   [facility] [place], Corrie Dandy T, NP   4 years ago Tinea cruris   [facility] [place], [place]              Passed - AST in normal range and within 360 days    AST  Date Value Ref Range Status  04/07/2023 21 0 - 40 IU/L Final   SGOT(AST)  Date Value Ref Range Status  05/08/2014 21 15 - 37 Unit/L Final         Passed - ALT in normal range and within 360 days    ALT  Date Value Ref Range Status  04/07/2023 33 0 - 44 IU/L Final   SGPT (ALT)  Date Value Ref Range Status  05/08/2014 39 U/L Final    Comment:    14-63 NOTE: New Reference Range 01/24/14          Passed - Last BP in normal range    BP Readings from Last 1 Encounters:  07/04/22 129/83

## 2023-08-03 NOTE — Telephone Encounter (Signed)
Pt needs an appt

## 2023-08-09 DIAGNOSIS — R7309 Other abnormal glucose: Secondary | ICD-10-CM | POA: Insufficient documentation

## 2023-08-09 NOTE — Patient Instructions (Signed)

## 2023-08-13 ENCOUNTER — Ambulatory Visit: Payer: BC Managed Care – PPO | Admitting: Nurse Practitioner

## 2023-08-13 ENCOUNTER — Encounter: Payer: Self-pay | Admitting: Nurse Practitioner

## 2023-08-13 VITALS — BP 126/75 | HR 84 | Temp 98.4°F | Ht 73.0 in | Wt 296.6 lb

## 2023-08-13 DIAGNOSIS — Z0000 Encounter for general adult medical examination without abnormal findings: Secondary | ICD-10-CM

## 2023-08-13 DIAGNOSIS — E669 Obesity, unspecified: Secondary | ICD-10-CM | POA: Diagnosis not present

## 2023-08-13 DIAGNOSIS — E7800 Pure hypercholesterolemia, unspecified: Secondary | ICD-10-CM

## 2023-08-13 DIAGNOSIS — Z23 Encounter for immunization: Secondary | ICD-10-CM

## 2023-08-13 DIAGNOSIS — R7309 Other abnormal glucose: Secondary | ICD-10-CM

## 2023-08-13 LAB — BAYER DCA HB A1C WAIVED: HB A1C (BAYER DCA - WAIVED): 5.8 % — ABNORMAL HIGH (ref 4.8–5.6)

## 2023-08-13 MED ORDER — TADALAFIL 5 MG PO TABS: ORAL_TABLET | ORAL | 12 refills | Status: AC

## 2023-08-13 MED ORDER — VALACYCLOVIR HCL 1 G PO TABS: 1000.0000 mg | ORAL_TABLET | Freq: Two times a day (BID) | ORAL | 3 refills | Status: AC | PRN

## 2023-08-13 NOTE — Assessment & Plan Note (Signed)
 Noted on past labs -- ASCVD 3.4% and LDL <190. Continue diet focus and check lipid and CMP today.

## 2023-08-13 NOTE — Assessment & Plan Note (Signed)
 BMI 39.13, some gain present from last visit.  Praised for this.  Recommended eating smaller high protein, low fat meals more frequently and exercising 30 mins a day 5 times a week with a goal of 10-15lb weight loss in the next 3 months. Patient voiced their understanding and motivation to adhere to these recommendations.

## 2023-08-13 NOTE — Progress Notes (Signed)
 BP 126/75   Pulse 84   Temp 98.4 F (36.9 C) (Oral)   Ht 6' 1 (1.854 m)   Wt 296 lb 9.6 oz (134.5 kg)   SpO2 98%   BMI 39.13 kg/m    Subjective:    Patient ID: Robert Jensen, male    DOB: [birth year], 43 y.o.   MRN: [id number]  HPI: Robert Jensen is a 43 y.o. male presenting on 08/13/2023 for comprehensive medical examination. Current medical complaints include:none  He currently lives with: significant other Interim Problems from his last visit: no  Impaired Fasting Glucose HbA1C:  Lab Results  Component Value Date   HGBA1C 6.4 (H) 07/04/2022  Duration of elevated blood sugar: weeks Polydipsia: no Polyuria: no Weight change: yes Visual disturbance: no Glucose Monitoring: no    Accucheck frequency: Not Checking    Fasting glucose:     Post prandial:  Diabetic Education: Not Completed Family history of diabetes: yes -- heart disease and diabetes on mother's side  The 10-year ASCVD risk score (Arnett DK, et al., 2019) is: 3.4%   Values used to calculate the score:     Age: 74 years     Sex: Male     Is Non-Hispanic African American: No     Diabetic: No     Tobacco smoker: No     Systolic Blood Pressure: 126 mmHg     Is BP treated: No     HDL Cholesterol: 29 mg/dL     Total Cholesterol: 207 mg/dL  Functional Status Survey: Is the patient deaf or have difficulty hearing?: No Does the patient have difficulty seeing, even when wearing glasses/contacts?: No Does the patient have difficulty concentrating, remembering, or making decisions?: No Does the patient have difficulty walking or climbing stairs?: No Does the patient have difficulty dressing or bathing?: No Does the patient have difficulty doing errands alone such as visiting a doctor's office or shopping?: No  FALL RISK:    08/13/2023   10:50 AM 07/04/2022    1:39 PM 07/04/2022    1:19 PM 06/21/2021   11:12 AM 04/27/2020    1:57 PM  Fall Risk   Falls in the past year? 0 0 1 0 0  Number  falls in past yr: 0 0 1 0 0  Injury with Fall? 0 0 1 0 0  Risk for fall due to : No Fall Risks No Fall Risks No Fall Risks No Fall Risks No Fall Risks  Follow up Falls evaluation completed Falls prevention discussed Falls evaluation completed Falls prevention discussed Falls evaluation completed      08/13/2023   10:52 AM 07/04/2022    1:19 PM 06/21/2021   11:06 AM 05/20/2018    1:26 PM  GAD 7 : Generalized Anxiety Score  Nervous, Anxious, on Edge 1 0 0 0  Control/stop worrying 0 0 0 0  Worry too much - different things 0 0 0 0  Trouble relaxing 1 0 1 0  Restless 0 0 0 0  Easily annoyed or irritable 1 1 2  0  Afraid - awful might happen 0 0 0 0  Total GAD 7 Score 3 1 3  0  Anxiety Difficulty Not difficult at all Not difficult at all Not difficult at all    Depression Screen    08/13/2023   10:50 AM 07/04/2022    1:19 PM 06/21/2021   11:05 AM 04/27/2020    1:59 PM 05/23/2019    2:07 PM  Depression screen PHQ 2/9  Decreased Interest 0 0 0 0 0  Down, Depressed, Hopeless 1 0 0 0 0  PHQ - 2 Score 1 0 0 0 0  Altered sleeping 1 1 2     Tired, decreased energy 1 0 1    Change in appetite 0 0 0    Feeling bad or failure about yourself  0 0 0    Trouble concentrating 0 0 0    Moving slowly or fidgety/restless 0 0 0    Suicidal thoughts 0 0 0    PHQ-9 Score 3 1 3     Difficult doing work/chores Not difficult at all Not difficult at all Not difficult at all     Past Medical History:  History reviewed. No pertinent past medical history.  Surgical History:  History reviewed. No pertinent surgical history.  Medications:  Current Outpatient Medications on File Prior to Visit  Medication Sig   clotrimazole -betamethasone  (LOTRISONE ) cream Apply 1 Application topically daily.   meloxicam  (MOBIC ) 15 MG tablet TAKE 1 TABLET(15 MG) BY MOUTH DAILY   terbinafine  (LAMISIL ) 250 MG tablet Take 1 tablet (250 mg total) by mouth daily.   No current facility-administered medications on file prior  to visit.    Allergies:  Allergies  Allergen Reactions   Other Rash    Nitrile latex free gloves    Social History:  Social History   Socioeconomic History   Marital status: Single    Spouse name: Not on file   Number of children: Not on file   Years of education: Not on file   Highest education level: Master's degree (e.g., MA, MS, MEng, MEd, MSW, MBA)  Occupational History   Not on file  Tobacco Use   Smoking status: Former    Current packs/day: 0.00    Types: Cigarettes    Quit date: 05/07/2010    Years since quitting: 13.2   Smokeless tobacco: Former    Quit date: 05/07/1998  Vaping Use   Vaping status: Never Used  Substance and Sexual Activity   Alcohol use: Yes    Alcohol/week: 20.0 standard drinks of alcohol    Types: 20 Cans of beer per week   Drug use: Never   Sexual activity: Yes  Other Topics Concern   Not on file  Social History Narrative   Not on file   Social Drivers of Health   Financial Resource Strain: Low Risk  (08/09/2023)   Overall Financial Resource Strain (CARDIA)    Difficulty of Paying Living Expenses: Not very hard  Food Insecurity: No Food Insecurity (08/09/2023)   Hunger Vital Sign    Worried About Running Out of Food in the Last Year: Never true    Ran Out of Food in the Last Year: Never true  Transportation Needs: No Transportation Needs (08/09/2023)   PRAPARE - Administrator, Civil Service (Medical): No    Lack of Transportation (Non-Medical): No  Physical Activity: Insufficiently Active (08/09/2023)   Exercise Vital Sign    Days of Exercise per Week: 2 days    Minutes of Exercise per Session: 10 min  Stress: Stress Concern Present (08/09/2023)   [employer] of Occupational Health - Occupational Stress Questionnaire    Feeling of Stress : To some extent  Social Connections: Moderately Integrated (08/09/2023)   Social Connection and Isolation Panel [NHANES]    Frequency of Communication with Friends and Family: More than  three times a week    Frequency of Social Gatherings with Friends and Family: Once  a week    Attends Religious Services: 1 to 4 times per year    Active Member of Clubs or Organizations: No    Attends Banker Meetings: Not on file    Marital Status: Living with partner  Intimate Partner Violence: Not At Risk (06/21/2021)   Humiliation, Afraid, Rape, and Kick questionnaire    Fear of Current or Ex-Partner: No    Emotionally Abused: No    Physically Abused: No    Sexually Abused: No   Social History   Tobacco Use  Smoking Status Former   Current packs/day: 0.00   Types: Cigarettes   Quit date: 05/07/2010   Years since quitting: 13.2  Smokeless Tobacco Former   Quit date: 05/07/1998   Social History   Substance and Sexual Activity  Alcohol Use Yes   Alcohol/week: 20.0 standard drinks of alcohol   Types: 20 Cans of beer per week    Family History:  Family History  Adopted: Yes  Problem Relation Age of Onset   Diabetes Maternal Grandmother    Heart disease Maternal Grandfather    Past medical history, surgical history, medications, allergies, family history and social history reviewed with patient today and changes made to appropriate areas of the chart.   Review of Systems - negative All other ROS negative except what is listed above and in the HPI.      Objective:    BP 126/75   Pulse 84   Temp 98.4 F (36.9 C) (Oral)   Ht 6' 1 (1.854 m)   Wt 296 lb 9.6 oz (134.5 kg)   SpO2 98%   BMI 39.13 kg/m   Wt Readings from Last 3 Encounters:  08/13/23 296 lb 9.6 oz (134.5 kg)  07/04/22 275 lb 14.4 oz (125.1 kg)  06/21/21 298 lb 3.2 oz (135.3 kg)    Physical Exam Vitals and nursing note reviewed.  Constitutional:      General: He is awake. He is not in acute distress.    Appearance: He is well-developed and well-groomed. He is obese. He is not ill-appearing or toxic-appearing.  HENT:     Head: Normocephalic and atraumatic.     Right Ear: Hearing,  tympanic membrane, ear canal and external ear normal. No drainage.     Left Ear: Hearing, tympanic membrane, ear canal and external ear normal. No drainage.     Nose: Nose normal.     Mouth/Throat:     Pharynx: Uvula midline.  Eyes:     General: Lids are normal.        Right eye: No discharge.        Left eye: No discharge.     Extraocular Movements: Extraocular movements intact.     Conjunctiva/sclera: Conjunctivae normal.     Pupils: Pupils are equal, round, and reactive to light.     Visual Fields: Right eye visual fields normal and left eye visual fields normal.  Neck:     Thyroid: No thyromegaly.     Vascular: No carotid bruit or JVD.     Trachea: Trachea normal.  Cardiovascular:     Rate and Rhythm: Normal rate and regular rhythm.     Heart sounds: Normal heart sounds, S1 normal and S2 normal. No murmur heard.    No gallop.  Pulmonary:     Effort: Pulmonary effort is normal. No accessory muscle usage or respiratory distress.     Breath sounds: Normal breath sounds.  Abdominal:     General: Bowel sounds  are normal.     Palpations: Abdomen is soft. There is no hepatomegaly or splenomegaly.     Tenderness: There is no abdominal tenderness.  Musculoskeletal:        General: Normal range of motion.     Cervical back: Normal range of motion and neck supple.     Right lower leg: No edema.     Left lower leg: No edema.  Lymphadenopathy:     Head:     Right side of head: No submental, submandibular, tonsillar, preauricular or posterior auricular adenopathy.     Left side of head: No submental, submandibular, tonsillar, preauricular or posterior auricular adenopathy.     Cervical: No cervical adenopathy.  Skin:    General: Skin is warm and dry.     Capillary Refill: Capillary refill takes less than 2 seconds.     Findings: No rash.  Neurological:     Mental Status: He is alert and oriented to person, place, and time.     Gait: Gait is intact.     Deep Tendon Reflexes:  Reflexes are normal and symmetric.     Reflex Scores:      Brachioradialis reflexes are 2+ on the right side and 2+ on the left side.      Patellar reflexes are 2+ on the right side and 2+ on the left side. Psychiatric:        Attention and Perception: Attention normal.        Mood and Affect: Mood normal.        Speech: Speech normal.        Behavior: Behavior normal. Behavior is cooperative.        Thought Content: Thought content normal.        Cognition and Memory: Cognition normal.        Judgment: Judgment normal.    Results for orders placed or performed in visit on 04/07/23  Hepatic function panel   Collection Time: 04/07/23  8:25 AM  Result Value Ref Range   Total Protein 7.1 6.0 - 8.5 g/dL   Albumin 4.4 4.1 - 5.1 g/dL   Bilirubin Total 0.3 0.0 - 1.2 mg/dL   Bilirubin, Direct 9.89 0.00 - 0.40 mg/dL   Alkaline Phosphatase 109 44 - 121 IU/L   AST 21 0 - 40 IU/L   ALT 33 0 - 44 IU/L      Assessment & Plan:   Problem List Items Addressed This Visit       Other   Elevated hemoglobin A1c - Primary   Improving with downward trend today at 5.8%, previous was 6.4%.  Praised for this.  If any elevations in future would benefit [employer] for diet and weight loss, however at present this is not covered by insurance.      Relevant Orders   Bayer DCA Hb A1c Waived   Comprehensive metabolic panel   Elevated low density lipoprotein (LDL) cholesterol level   Noted on past labs -- ASCVD 3.4% and LDL <190. Continue diet focus and check lipid and CMP today.      Relevant Orders   Comprehensive metabolic panel   Lipid Panel w/o Chol/HDL Ratio   Obesity (BMI 35.0-39.9 without comorbidity)   BMI 39.13, some gain present from last visit.  Praised for this.  Recommended eating smaller high protein, low fat meals more frequently and exercising 30 mins a day 5 times a week with a goal of 10-15lb weight loss in the next 3 months. Patient voiced their  understanding and motivation to adhere  to these recommendations.       Relevant Orders   CBC with Differential/Platelet   TSH   Other Visit Diagnoses       Need for Tdap vaccination       Tdap today, educated patient.   Relevant Orders   Tdap vaccine greater than or equal to 7yo IM (Completed)     Encounter for annual physical exam       Annual physical today with labs and health maintenance reviewed, discussed with patient.   Relevant Orders   CBC with Differential/Platelet   TSH       Discussed aspirin prophylaxis for myocardial infarction prevention and decision was it was not indicated  LABORATORY TESTING:  Health maintenance labs ordered today as discussed above.   IMMUNIZATIONS:   - Tetanus vaccination status reviewed: last tetanus booster within 10 years. - Influenza: is going to hold off - Pneumovax: Not applicable - Prevnar: Not applicable - Zostavax vaccine: Not applicable - COVID -- has had 2  SCREENING: - Colonoscopy: Not applicable  Discussed with patient purpose of the colonoscopy is to detect colon cancer at curable precancerous or early stages   - AAA Screening: Not applicable  -Hearing Test: Not applicable  -Spirometry: Not applicable   PATIENT COUNSELING:    Sexuality: Discussed sexually transmitted diseases, partner selection, use of condoms, avoidance of unintended pregnancy  and contraceptive alternatives.   Advised to avoid cigarette smoking.  I discussed with the patient that most people either abstain from alcohol or drink within safe limits (<=14/week and <=4 drinks/occasion for males, <=7/weeks and <= 3 drinks/occasion for females) and that the risk for alcohol disorders and other health effects rises proportionally with the number of drinks per week and how often a drinker exceeds daily limits.  Discussed cessation/primary prevention of drug use and availability of treatment for abuse.   Diet: Encouraged to adjust caloric intake to maintain  or achieve ideal body weight, to  reduce intake of dietary saturated fat and total fat, to limit sodium intake by avoiding high sodium foods and not adding table salt, and to maintain adequate dietary potassium and calcium preferably from fresh fruits, vegetables, and low-fat dairy products.    Stressed the importance of regular exercise  Injury prevention: Discussed safety belts, safety helmets, smoke detector, smoking near bedding or upholstery.   Dental health: Discussed importance of regular tooth brushing, flossing, and dental visits.   Follow up plan: NEXT PREVENTATIVE PHYSICAL DUE IN 1 YEAR. Return in about 1 year (around 08/12/2024) for Annual Physical.

## 2023-08-13 NOTE — Assessment & Plan Note (Signed)
 Improving with downward trend today at 5.8%, previous was 6.4%.  Praised for this.  If any elevations in future would benefit [employer] for diet and weight loss, however at present this is not covered by insurance.

## 2023-08-14 ENCOUNTER — Encounter: Payer: Self-pay | Admitting: Nurse Practitioner

## 2023-08-14 LAB — COMPREHENSIVE METABOLIC PANEL
ALT: 38 [IU]/L (ref 0–44)
AST: 24 [IU]/L (ref 0–40)
Albumin: 4.4 g/dL (ref 4.1–5.1)
Alkaline Phosphatase: 99 [IU]/L (ref 44–121)
BUN/Creatinine Ratio: 13 (ref 9–20)
BUN: 11 mg/dL (ref 6–24)
Bilirubin Total: 0.6 mg/dL (ref 0.0–1.2)
CO2: 21 mmol/L (ref 20–29)
Calcium: 8.9 mg/dL (ref 8.7–10.2)
Chloride: 103 mmol/L (ref 96–106)
Creatinine, Ser: 0.83 mg/dL (ref 0.76–1.27)
Globulin, Total: 2.6 g/dL (ref 1.5–4.5)
Glucose: 116 mg/dL — ABNORMAL HIGH (ref 70–99)
Potassium: 4.2 mmol/L (ref 3.5–5.2)
Sodium: 137 mmol/L (ref 134–144)
Total Protein: 7 g/dL (ref 6.0–8.5)
eGFR: 111 mL/min/{1.73_m2} (ref >59)

## 2023-08-14 LAB — CBC WITH DIFFERENTIAL/PLATELET
Basophils Absolute: 0 10*3/uL (ref 0.0–0.2)
Basos: 1 %
EOS (ABSOLUTE): 0.2 10*3/uL (ref 0.0–0.4)
Eos: 4 %
Hematocrit: 49 % (ref 37.5–51.0)
Hemoglobin: 16.3 g/dL (ref 13.0–17.7)
Immature Grans (Abs): 0 10*3/uL (ref 0.0–0.1)
Immature Granulocytes: 0 %
Lymphocytes Absolute: 1 10*3/uL (ref 0.7–3.1)
Lymphs: 20 %
MCH: 31.1 pg (ref 26.6–33.0)
MCHC: 33.3 g/dL (ref 31.5–35.7)
MCV: 94 fL (ref 79–97)
Monocytes Absolute: 0.6 10*3/uL (ref 0.1–0.9)
Monocytes: 11 %
Neutrophils Absolute: 3.2 10*3/uL (ref 1.4–7.0)
Neutrophils: 64 %
Platelets: 218 10*3/uL (ref 150–450)
RBC: 5.24 x10E6/uL (ref 4.14–5.80)
RDW: 13.5 % (ref 11.6–15.4)
WBC: 5.1 10*3/uL (ref 3.4–10.8)

## 2023-08-14 LAB — LIPID PANEL W/O CHOL/HDL RATIO
Cholesterol, Total: 244 mg/dL — ABNORMAL HIGH (ref 100–199)
HDL: 38 mg/dL — ABNORMAL LOW (ref >39)
LDL Chol Calc (NIH): 185 mg/dL — ABNORMAL HIGH (ref 0–99)
Triglycerides: 118 mg/dL (ref 0–149)
VLDL Cholesterol Cal: 21 mg/dL (ref 5–40)

## 2023-08-14 LAB — TSH: TSH: 1.54 u[IU]/mL (ref 0.450–4.500)

## 2023-08-14 NOTE — Progress Notes (Signed)
 Contacted via MyChart   Good afternoon Robert Jensen, your labs have returned: - Kidney function, creatinine and eGFR, remains normal, as is liver function, AST and ALT.  - CBC and thyroid show normal levels. - Lipid panel continues to show elevations, if the LDL (bad cholesterol) gets to 190 or more I will recommend starting medication.  Ensure to come fasting at next physical if possible. Any questions? The LDL is the bad cholesterol. Over time and in combination with inflammation and other factors, this contributes to plaque which in turn may lead to stroke and/or heart attack down the road. Sometimes high LDL is primarily genetic, and people might be eating all the right foods but still have high numbers. Other times, there is room for improvement in one's diet and eating healthier can bring this number down and potentially reduce one's risk of heart attack and/or stroke. To reduce your LDL, Remember - more fruits and vegetables, more fish, and limit red meat and dairy products. More soy, nuts, beans, barley, lentils, oats and plant sterol ester enriched margarine instead of butter. I also encourage eliminating sugar and processed food. Remember, shop on the outside of the grocery store and visit your [employer].  Keep being stellar!!  Thank you for allowing me to participate in your care.  I appreciate you. Kindest regards, D[place]ss Crosland

## 2023-10-05 ENCOUNTER — Encounter: Payer: Self-pay | Admitting: Nurse Practitioner

## 2024-01-26 ENCOUNTER — Encounter: Payer: Self-pay | Admitting: Nurse Practitioner

## 2024-01-26 NOTE — Telephone Encounter (Signed)
 appt

## 2024-01-27 NOTE — Telephone Encounter (Signed)
 Scheduled

## 2024-01-28 NOTE — Telephone Encounter (Signed)
 Noted

## 2024-03-06 NOTE — Patient Instructions (Signed)
 Sleep Apnea Test: What to Expect  Sleep apnea is a condition that affects your breathing while you're sleeping. You may have shallow breathing or stop breathing for short periods of time. Sleep apnea screening is a test to check if you're at risk for sleep apnea. The test includes questions. It will only takes a few minutes. Your health care provider may ask you to have this test before a surgery or as part of a physical exam. What are the symptoms of sleep apnea? Snoring. Waking up often at night. Daytime sleepiness. Pauses in breathing. Choking or gasping during sleep. Being annoyed easily. Forgetfulness. Trouble thinking clearly. Depression. Personality changes. Headaches in the morning. Most people with sleep apnea do not know that they have it. What are the advantages of sleep apnea screening? Getting screened for sleep apnea can help: Keep you safer. Your providers need to know whether or not you have sleep apnea, especially if you're having surgery or have other long-term, or chronic, health conditions. Improve your health and help you get a better night's rest. Restful sleep can help you: Have more energy. Lose weight. Improve high blood pressure. Improve diabetes management. Prevent stroke. Prevent car accidents. What happens before the screening? You may talk with your provider about the screening and what other tests may be recommended based on the screening. What happens during the screening? Screening usually includes being asked a list of questions about your sleep quality. Some questions you may be asked include: Do you snore? Is your sleep restless? Do you have daytime sleepiness? Has a partner or spouse told you that you stop breathing, choke, or gasp during sleep? Have you had trouble concentrating or memory loss? What is your age? What is your neck circumference? To measure your neck, keep your back straight and gently wrap the tape measure around your neck.  Put the tape measure at the middle of your neck, between your chin and collarbone. What is your sex assigned at birth? Do you have high blood pressure or are you being treated for high blood pressure? If your screening test is positive, you're at risk for the condition. More tests may be needed to confirm a diagnosis of sleep apnea. What can I expect after the screening? Your provider will go over the results of the screening with you and make recommendations based on the results of the test. Where to find more information You can find screening tools online or at your health care clinic. To learn more, go to these websites: Centers for Disease Control and Prevention: DiningCalendar.de. Then: Click Health Topics A-Z. Type "sleep apnea" in the search box. National Heart, Lung, and Blood Institute: [employer]DryCleaner.gl Contact a health care provider if: You think that you may have sleep apnea. This information is not intended to replace advice given to you by your health care provider. Make sure you discuss any questions you have with your health care provider. Document Revised: 11/29/2022 Document Reviewed: 11/29/2022 Elsevier Patient Education  2024 [employer].

## 2024-03-08 ENCOUNTER — Ambulatory Visit: Admitting: Nurse Practitioner

## 2024-03-08 ENCOUNTER — Encounter: Payer: Self-pay | Admitting: Nurse Practitioner

## 2024-03-08 VITALS — BP 128/80 | HR 82 | Temp 99.6°F | Ht 73.0 in | Wt 296.0 lb

## 2024-03-08 DIAGNOSIS — R0683 Snoring: Secondary | ICD-10-CM | POA: Diagnosis not present

## 2024-03-08 DIAGNOSIS — R509 Fever, unspecified: Secondary | ICD-10-CM | POA: Diagnosis not present

## 2024-03-08 DIAGNOSIS — E669 Obesity, unspecified: Secondary | ICD-10-CM

## 2024-03-08 DIAGNOSIS — G4733 Obstructive sleep apnea (adult) (pediatric): Secondary | ICD-10-CM | POA: Insufficient documentation

## 2024-03-08 LAB — POC COVID19/FLU A&B COMBO
Covid Antigen, POC: NEGATIVE
Influenza A Antigen, POC: NEGATIVE
Influenza B Antigen, POC: NEGATIVE

## 2024-03-08 NOTE — Assessment & Plan Note (Signed)
BMI 39.05.  Recommended eating smaller high protein, low fat meals more frequently and exercising 30 mins a day 5 times a week with a goal of 10-15lb weight loss in the next 3 months. Patient voiced their understanding and motivation to adhere to these recommendations.

## 2024-03-08 NOTE — Assessment & Plan Note (Signed)
 Ongoing issue.  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  Will order sleep study as does have some risk factors for OSA. Determine next steps after study returns.  Discussed with patient.

## 2024-03-08 NOTE — Progress Notes (Signed)
 BP 128/80 (BP Location: Left Arm, Patient Position: Sitting)   Pulse 82   Temp 99.6 F (37.6 C) (Oral)   Ht 6' 1 (1.854 m)   Wt 296 lb (134.3 kg)   SpO2 96%   BMI 39.05 kg/m    Subjective:    Patient ID: Robert Jensen, male    DOB: 09/24/1980, 43 y.o.   MRN: [id number]  HPI: Robert Jensen is a 43 y.o. male  Chief Complaint  Patient presents with   Snoring   Insomnia   Nasal Congestion    Declines covid/flu test   Cough   UPPER RESPIRATORY TRACT INFECTION Been sick for two days.  No Covid or flu testing at home.  Fever: yes in office today Cough: yes Shortness of breath: no Wheezing: yes Chest pain: no Chest tightness: no Chest congestion: no Nasal congestion: yes Runny nose: yes Post nasal drip: yes Sneezing: no Sore throat: no Swollen glands: no Sinus pressure: yes Headache: yes Face pain: no Toothache: no Ear pain: none Ear pressure: none Eyes red/itching:no Eye drainage/crusting: no  Vomiting: no Rash: no Fatigue: yes Sick contacts: yes girlfriend was sick, went to [place]  last week -- flew Strep contacts: no  Context: stable Recurrent sinusitis: no Relief with OTC cold/cough medications: yes  Treatments attempted: Dayquil and Nyquil    INSOMNIA Present for months and months. Snoring has gotten worse.  Does not wake up with headaches. Duration: chronic Satisfied with sleep quality: no Difficulty falling asleep: no Difficulty staying asleep: yes Waking a few hours after sleep onset: yes Early morning awakenings: no Daytime hypersomnolence: sometimes Wakes feeling refreshed: 50/50 Good sleep hygiene: yes Apnea: possible per girlfriend Snoring: yes Depressed/anxious mood: no Recent stress: no Restless legs/nocturnal leg cramps: no Chronic pain/arthritis: no History of sleep study: no Treatments attempted: none    Relevant past medical, surgical, family and social history reviewed and updated as indicated. Interim medical  history since our last visit reviewed. Allergies and medications reviewed and updated.  Review of Systems  Constitutional:  Positive for fatigue. Negative for activity change, chills, diaphoresis and fever.  HENT:  Positive for congestion, postnasal drip, rhinorrhea, sinus pressure and sinus pain. Negative for ear discharge, ear pain and sore throat.   Respiratory:  Positive for cough. Negative for chest tightness, shortness of breath and wheezing.   Cardiovascular:  Negative for chest pain, palpitations and leg swelling.  Gastrointestinal: Negative.   Neurological:  Positive for headaches. Negative for dizziness, syncope, weakness, light-headedness and numbness.  Psychiatric/Behavioral: Negative.      Per HPI unless specifically indicated above     Objective:    BP 128/80 (BP Location: Left Arm, Patient Position: Sitting)   Pulse 82   Temp 99.6 F (37.6 C) (Oral)   Ht 6' 1 (1.854 m)   Wt 296 lb (134.3 kg)   SpO2 96%   BMI 39.05 kg/m   Wt Readings from Last 3 Encounters:  03/08/24 296 lb (134.3 kg)  08/13/23 296 lb 9.6 oz (134.5 kg)  07/04/22 275 lb 14.4 oz (125.1 kg)    Physical Exam Vitals and nursing note reviewed.  Constitutional:      General: He is awake. He is not in acute distress.    Appearance: He is well-developed and well-groomed. He is obese. He is not ill-appearing or toxic-appearing.  HENT:     Head: Normocephalic.     Right Ear: Hearing, ear canal and external ear normal. No tenderness. A middle ear effusion is present.  Tympanic membrane is not injected or perforated.     Left Ear: Hearing, ear canal and external ear normal. No tenderness. A middle ear effusion is present. Tympanic membrane is not injected or perforated.     Nose: Rhinorrhea present. Rhinorrhea is clear.     Right Sinus: No maxillary sinus tenderness or frontal sinus tenderness.     Left Sinus: No maxillary sinus tenderness or frontal sinus tenderness.     Mouth/Throat:     Mouth: Mucous  membranes are moist.     Pharynx: Posterior oropharyngeal erythema (mild) present. No pharyngeal swelling or oropharyngeal exudate.     Comments: Mallampati Class III. Eyes:     General: Lids are normal.     Extraocular Movements: Extraocular movements intact.     Conjunctiva/sclera: Conjunctivae normal.  Neck:     Thyroid: No thyromegaly.     Vascular: No carotid bruit.  Cardiovascular:     Rate and Rhythm: Normal rate and regular rhythm.     Heart sounds: Normal heart sounds.  Pulmonary:     Effort: Pulmonary effort is normal. No accessory muscle usage or respiratory distress.     Breath sounds: Normal breath sounds. No decreased breath sounds, wheezing or rales.  Abdominal:     General: Bowel sounds are normal. There is no distension.     Palpations: Abdomen is soft.     Tenderness: There is no abdominal tenderness.  Musculoskeletal:     Cervical back: Full passive range of motion without pain.     Right lower leg: No edema.     Left lower leg: No edema.  Lymphadenopathy:     Head:     Right side of head: No submental, submandibular, tonsillar, preauricular or posterior auricular adenopathy.     Left side of head: No submental, submandibular, tonsillar, preauricular or posterior auricular adenopathy.     Cervical: No cervical adenopathy.  Skin:    General: Skin is warm.     Capillary Refill: Capillary refill takes less than 2 seconds.  Neurological:     Mental Status: He is alert and oriented to person, place, and time.     Deep Tendon Reflexes: Reflexes are normal and symmetric.     Reflex Scores:      Brachioradialis reflexes are 2+ on the right side and 2+ on the left side.      Patellar reflexes are 2+ on the right side and 2+ on the left side. Psychiatric:        Attention and Perception: Attention normal.        Mood and Affect: Mood normal.        Speech: Speech normal.        Behavior: Behavior normal. Behavior is cooperative.        Thought Content: Thought  content normal.    Results for orders placed or performed in visit on 08/13/23  Bayer DCA Hb A1c Waived   Collection Time: 08/13/23 11:00 AM  Result Value Ref Range   HB A1C (BAYER DCA - WAIVED) 5.8 (H) 4.8 - 5.6 %  Comprehensive metabolic panel   Collection Time: 08/13/23 11:01 AM  Result Value Ref Range   Glucose 116 (H) 70 - 99 mg/dL   BUN 11 6 - 24 mg/dL   Creatinine, Ser 9.16 0.76 - 1.27 mg/dL   eGFR 888 >40 fO/fpw/8.26   BUN/Creatinine Ratio 13 9 - 20   Sodium 137 134 - 144 mmol/L   Potassium 4.2 3.5 - 5.2 mmol/L  Chloride 103 96 - 106 mmol/L   CO2 21 20 - 29 mmol/L   Calcium 8.9 8.7 - 10.2 mg/dL   Total Protein 7.0 6.0 - 8.5 g/dL   Albumin 4.4 4.1 - 5.1 g/dL   Globulin, Total 2.6 1.5 - 4.5 g/dL   Bilirubin Total 0.6 0.0 - 1.2 mg/dL   Alkaline Phosphatase 99 44 - 121 IU/L   AST 24 0 - 40 IU/L   ALT 38 0 - 44 IU/L  CBC with Differential/Platelet   Collection Time: 08/13/23 11:01 AM  Result Value Ref Range   WBC 5.1 3.4 - 10.8 x10E3/uL   RBC 5.24 4.14 - 5.80 x10E6/uL   Hemoglobin 16.3 13.0 - 17.7 g/dL   Hematocrit 50.9 62.4 - 51.0 %   MCV 94 79 - 97 fL   MCH 31.1 26.6 - 33.0 pg   MCHC 33.3 31.5 - 35.7 g/dL   RDW 86.4 88.3 - 84.5 %   Platelets 218 150 - 450 x10E3/uL   Neutrophils 64 Not Estab. %   Lymphs 20 Not Estab. %   Monocytes 11 Not Estab. %   Eos 4 Not Estab. %   Basos 1 Not Estab. %   Neutrophils Absolute 3.2 1.4 - 7.0 x10E3/uL   Lymphocytes Absolute 1.0 0.7 - 3.1 x10E3/uL   Monocytes Absolute 0.6 0.1 - 0.9 x10E3/uL   EOS (ABSOLUTE) 0.2 0.0 - 0.4 x10E3/uL   Basophils Absolute 0.0 0.0 - 0.2 x10E3/uL   Immature Granulocytes 0 Not Estab. %   Immature Grans (Abs) 0.0 0.0 - 0.1 x10E3/uL  Lipid Panel w/o Chol/HDL Ratio   Collection Time: 08/13/23 11:01 AM  Result Value Ref Range   Cholesterol, Total 244 (H) 100 - 199 mg/dL   Triglycerides 881 0 - 149 mg/dL   HDL 38 (L) >60 mg/dL   VLDL Cholesterol Cal 21 5 - 40 mg/dL   LDL Chol Calc (NIH) 814 (H) 0 -  99 mg/dL  TSH   Collection Time: 08/13/23 11:01 AM  Result Value Ref Range   TSH 1.540 0.450 - 4.500 uIU/mL      Assessment & Plan:   Problem List Items Addressed This Visit       Other   Snoring - Primary   Ongoing issue.  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  Will order sleep study as does have some risk factors for OSA. Determine next steps after study returns.  Discussed with patient.      Relevant Orders   Ambulatory referral to Sleep Studies   Obesity (BMI 35.0-39.9 without comorbidity)   BMI 39.05.  Recommended eating smaller high protein, low fat meals more frequently and exercising 30 mins a day 5 times a week with a goal of 10-15lb weight loss in the next 3 months. Patient voiced their understanding and motivation to adhere to these recommendations.       Fever   Acute for 2 days after recent travel.  Covid and flu testing performed and negative.  Recommend: - Increased rest - Increasing Fluids - Acetaminophen as needed for fever/pain.  - Salt water gargling, chloraseptic spray and throat lozenges - OTC Claritin or Zyrtec for drainage. - Mucinex.  - Saline sinus flushes or a neti pot.  - Humidifying the air.       Relevant Orders   POC Covid19/Flu A&B Antigen     Follow up plan: No follow-ups on file.

## 2024-03-08 NOTE — Assessment & Plan Note (Signed)
 Acute for 2 days after recent travel.  Covid and flu testing performed and negative.  Recommend: - Increased rest - Increasing Fluids - Acetaminophen as needed for fever/pain.  - Salt water gargling, chloraseptic spray and throat lozenges - OTC Claritin or Zyrtec for drainage. - Mucinex.  - Saline sinus flushes or a neti pot.  - Humidifying the air.

## 2024-03-30 ENCOUNTER — Encounter: Payer: Self-pay | Admitting: Nurse Practitioner

## 2024-04-05 ENCOUNTER — Telehealth: Admitting: Physician Assistant

## 2024-04-05 DIAGNOSIS — M545 Low back pain, unspecified: Secondary | ICD-10-CM | POA: Diagnosis not present

## 2024-04-05 MED ORDER — CYCLOBENZAPRINE HCL 10 MG PO TABS
10.0000 mg | ORAL_TABLET | Freq: Three times a day (TID) | ORAL | 0 refills | Status: AC | PRN
Start: 2024-04-05 — End: ?

## 2024-04-05 NOTE — Patient Instructions (Signed)
  Robert Jensen, thank you for joining Robert Velma Lunger, PA-C for today's virtual visit.  While this provider is not your primary care provider (PCP), if your PCP is located in our provider database this encounter information will be shared with them immediately following your visit.   A [facility] MyChart account gives you access to today's visit and all your visits, tests, and labs performed at [facility]  click here if you don't have a [facility] MyChart account or go to [email or web address]  Consent: (Patient) Robert Jensen provided verbal consent for this virtual visit at the beginning of the encounter.  Current Medications:  Current Outpatient Medications:    cyclobenzaprine  (FLEXERIL ) 10 MG tablet, Take 1 tablet (10 mg total) by mouth 3 (three) times daily as needed., Disp: 15 tablet, Rfl: 0   tadalafil  (CIALIS ) 5 MG tablet, TAKE 1 TABLET(5 MG) BY MOUTH DAILY 30 MINUTES BEFORE SEXUAL ACTIVITY AS NEEDED FOR ERECTILE DYSFUNCTION, Disp: 30 tablet, Rfl: 12   valACYclovir  (VALTREX ) 1000 MG tablet, Take 1 tablet (1,000 mg total) by mouth 2 (two) times daily as needed (for cold sores)., Disp: 90 tablet, Rfl: 3   Medications ordered in this encounter:  Meds ordered this encounter  Medications   cyclobenzaprine  (FLEXERIL ) 10 MG tablet    Sig: Take 1 tablet (10 mg total) by mouth 3 (three) times daily as needed.    Dispense:  15 tablet    Refill:  0    Supervising Provider:   LAMPTEY, PHILIP O [[id number]]     *If you need refills on other medications prior to your next appointment, please contact your pharmacy*  Follow-Up: Call back or seek an in-person evaluation if the symptoms worsen or if the condition fails to improve as anticipated.  [facility] [phone]  Other Instructions Please avoid heavy lifting and overexertion. Ok to continue your OTC medications. Apply a heating pad to the area for 10-15 minutes, a few times per  day. Take the Flexeril  up to three times daily as directed, but no driving or working while taking this medication. If you note any non-resolving, new, or worsening symptoms despite treatment, please seek an in-person evaluation ASAP.    If you have been instructed to have an in-person evaluation today at a local Urgent Care facility, please use the link below. It will take you to a list of all of our available [facility], including address, phone number and hours of operation. Please do not delay care.  [facility]  If you or a family member do not have a primary care provider, use the link below to schedule a visit and establish care. When you choose a [facility] primary care physician or advanced practice provider, you gain a long-term partner in health. Find a Primary Care Provider  Learn more about [facility]'s in-office and virtual care options:  - Get Care Now

## 2024-04-05 NOTE — Progress Notes (Signed)
 Virtual Visit Consent   Robert Jensen, you are scheduled for a virtual visit with a [facility] provider today. Just as with appointments in the office, your consent must be obtained to participate. Your consent will be active for this visit and any virtual visit you may have with one of our providers in the next 365 days. If you have a MyChart account, a copy of this consent can be sent to you electronically.  As this is a virtual visit, video technology does not allow for your provider to perform a traditional examination. This may limit your provider's ability to fully assess your condition. If your provider identifies any concerns that need to be evaluated in person or the need to arrange testing (such as labs, EKG, etc.), we will make arrangements to do so. Although advances in technology are sophisticated, we cannot ensure that it will always work on either your end or our end. If the connection with a video visit is poor, the visit may have to be switched to a telephone visit. With either a video or telephone visit, we are not always able to ensure that we have a secure connection.  By engaging in this virtual visit, you consent to the provision of healthcare and authorize for your insurance to be billed (if applicable) for the services provided during this visit. Depending on your insurance coverage, you may receive a charge related to this service.  I need to obtain your verbal consent now. Are you willing to proceed with your visit today? Mazi Darsey has provided verbal consent on 04/05/2024 for a virtual visit (video or telephone). Robert Jensen, [place]  Date: 04/05/2024 1:20 PM   Virtual Visit via Video Note   I, Robert Jensen, connected with  K[facility] Bowsher  ([id number], [birth year]) on 04/05/24 at  1:30 PM EDT by a video-enabled telemedicine application and verified that I am speaking with the correct person using two identifiers.  Location: Patient:  Virtual Visit Location Patient: Home Provider: Virtual Visit Location Provider: Home Office   I discussed the limitations of evaluation and management by telemedicine and the availability of in person appointments. The patient expressed understanding and agreed to proceed.    History of Present Illness: Robert Jensen is a 43 y.o. who identifies as a male who was assigned male at birth, and is being seen today for bilateral low back pain since Sunday when he pulled his back. Denies trauma. Notes pain is persistent and worse with certain ROM. Denies decreased ROM, LLE numbness/weakness or saddle anesthesia. 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   Obesity (BMI 35.0-39.9 without comorbidity) 05/07/2018    Allergies:  Allergies  Allergen Reactions   Other Rash    Nitrile latex free gloves   Medications:  Current Outpatient Medications:    cyclobenzaprine  (FLEXERIL ) 10 MG tablet, Take 1 tablet (10 mg total) by mouth 3 (three) times daily as needed., Disp: 15 tablet, Rfl: 0   tadalafil  (CIALIS ) 5 MG tablet, TAKE 1 TABLET(5 MG) BY MOUTH DAILY 30 MINUTES BEFORE SEXUAL ACTIVITY AS NEEDED FOR ERECTILE DYSFUNCTION, Disp: 30 tablet, Rfl: 12   valACYclovir  (VALTREX ) 1000 MG tablet,  Take 1 tablet (1,000 mg total) by mouth 2 (two) times daily as needed (for cold sores)., Disp: 90 tablet, Rfl: 3  Observations/Objective: Patient is well-developed, well-nourished in no acute distress.  Resting comfortably  at home.  Head is normocephalic, atraumatic.  No labored breathing.  Speech is clear and coherent with logical content.  Patient is alert and  oriented at baseline.   Assessment and Plan: 1. Acute bilateral low back pain without sciatica (Primary) - cyclobenzaprine  (FLEXERIL ) 10 MG tablet; Take 1 tablet (10 mg total) by mouth 3 (three) times daily as needed.  Dispense: 15 tablet; Refill: 0  Atraumatic. Supportive measures and OTC medications reviewed. Will add on short course of Flexeril , not to be used when working. In-person follow-up for any non-resolving, new or worsening symptoms despite treatment.   Follow Up Instructions: I discussed the assessment and treatment plan with the patient. The patient was provided an opportunity to ask questions and all were answered. The patient agreed with the plan and demonstrated an understanding of the instructions.  A copy of instructions were sent to the patient via MyChart unless otherwise noted below.   The patient was advised to call back or seek an in-person evaluation if the symptoms worsen or if the condition fails to improve as anticipated.    Robert Velma Lunger, PA-C
